# Patient Record
Sex: Female | Born: 1935 | Race: White | Hispanic: No | Smoking: Never smoker
Health system: Southern US, Community
[De-identification: ages and names within clinical notes are randomized; demographics above are authoritative.]

## PROBLEM LIST (undated history)

## (undated) DIAGNOSIS — I447 Left bundle-branch block, unspecified: Secondary | ICD-10-CM

## (undated) DIAGNOSIS — I119 Hypertensive heart disease without heart failure: Secondary | ICD-10-CM

## (undated) DIAGNOSIS — F329 Major depressive disorder, single episode, unspecified: Secondary | ICD-10-CM

## (undated) DIAGNOSIS — I4891 Unspecified atrial fibrillation: Secondary | ICD-10-CM

## (undated) DIAGNOSIS — F0390 Unspecified dementia without behavioral disturbance: Secondary | ICD-10-CM

## (undated) DIAGNOSIS — F32A Depression, unspecified: Secondary | ICD-10-CM

## (undated) DIAGNOSIS — E039 Hypothyroidism, unspecified: Secondary | ICD-10-CM

## (undated) DIAGNOSIS — K219 Gastro-esophageal reflux disease without esophagitis: Secondary | ICD-10-CM

## (undated) DIAGNOSIS — C50919 Malignant neoplasm of unspecified site of unspecified female breast: Secondary | ICD-10-CM

## (undated) DIAGNOSIS — Z950 Presence of cardiac pacemaker: Secondary | ICD-10-CM

## (undated) DIAGNOSIS — C679 Malignant neoplasm of bladder, unspecified: Secondary | ICD-10-CM

## (undated) HISTORY — DX: Left bundle-branch block, unspecified: I44.7

## (undated) HISTORY — DX: Hypertensive heart disease without heart failure: I11.9

## (undated) HISTORY — DX: Presence of cardiac pacemaker: Z95.0

---

## 1998-01-25 ENCOUNTER — Encounter: Admission: RE | Admit: 1998-01-25 | Discharge: 1998-04-25 | Payer: Self-pay | Admitting: Oncology

## 1998-02-01 ENCOUNTER — Other Ambulatory Visit: Admission: RE | Admit: 1998-02-01 | Discharge: 1998-02-01 | Payer: Self-pay | Admitting: Oncology

## 1998-06-30 ENCOUNTER — Ambulatory Visit (HOSPITAL_COMMUNITY): Admission: RE | Admit: 1998-06-30 | Discharge: 1998-06-30 | Payer: Self-pay | Admitting: Ophthalmology

## 1999-02-06 ENCOUNTER — Encounter: Admission: RE | Admit: 1999-02-06 | Discharge: 1999-02-24 | Payer: Self-pay | Admitting: Oncology

## 1999-02-09 ENCOUNTER — Encounter (INDEPENDENT_AMBULATORY_CARE_PROVIDER_SITE_OTHER): Payer: Self-pay

## 1999-02-09 ENCOUNTER — Ambulatory Visit (HOSPITAL_COMMUNITY): Admission: RE | Admit: 1999-02-09 | Discharge: 1999-02-09 | Payer: Self-pay | Admitting: Obstetrics & Gynecology

## 1999-03-27 ENCOUNTER — Encounter: Admission: RE | Admit: 1999-03-27 | Discharge: 1999-03-27 | Payer: Self-pay | Admitting: Oncology

## 1999-06-09 ENCOUNTER — Encounter: Payer: Self-pay | Admitting: Obstetrics & Gynecology

## 1999-06-14 ENCOUNTER — Encounter (INDEPENDENT_AMBULATORY_CARE_PROVIDER_SITE_OTHER): Payer: Self-pay | Admitting: Specialist

## 1999-06-14 ENCOUNTER — Inpatient Hospital Stay (HOSPITAL_COMMUNITY): Admission: RE | Admit: 1999-06-14 | Discharge: 1999-06-17 | Payer: Self-pay | Admitting: Obstetrics & Gynecology

## 1999-10-12 ENCOUNTER — Encounter: Admission: RE | Admit: 1999-10-12 | Discharge: 2000-01-10 | Payer: Self-pay | Admitting: Oncology

## 1999-11-20 ENCOUNTER — Ambulatory Visit (HOSPITAL_COMMUNITY): Admission: RE | Admit: 1999-11-20 | Discharge: 1999-11-20 | Payer: Self-pay | Admitting: Internal Medicine

## 1999-11-20 ENCOUNTER — Encounter: Payer: Self-pay | Admitting: Internal Medicine

## 2000-01-26 ENCOUNTER — Encounter: Admission: RE | Admit: 2000-01-26 | Discharge: 2000-01-26 | Payer: Self-pay

## 2000-02-22 ENCOUNTER — Encounter: Admission: RE | Admit: 2000-02-22 | Discharge: 2000-02-26 | Payer: Self-pay | Admitting: Oncology

## 2000-07-11 ENCOUNTER — Encounter: Payer: Self-pay | Admitting: Gastroenterology

## 2000-07-11 ENCOUNTER — Encounter: Admission: RE | Admit: 2000-07-11 | Discharge: 2000-07-11 | Payer: Self-pay | Admitting: Gastroenterology

## 2000-12-18 ENCOUNTER — Ambulatory Visit (HOSPITAL_COMMUNITY): Admission: RE | Admit: 2000-12-18 | Discharge: 2000-12-18 | Payer: Self-pay | Admitting: Internal Medicine

## 2000-12-18 ENCOUNTER — Encounter: Payer: Self-pay | Admitting: Internal Medicine

## 2001-09-18 ENCOUNTER — Encounter: Admission: RE | Admit: 2001-09-18 | Discharge: 2001-10-09 | Payer: Self-pay | Admitting: Oncology

## 2002-01-06 ENCOUNTER — Encounter: Admission: RE | Admit: 2002-01-06 | Discharge: 2002-01-06 | Payer: Self-pay | Admitting: Rheumatology

## 2002-01-06 ENCOUNTER — Encounter: Payer: Self-pay | Admitting: Rheumatology

## 2002-01-07 ENCOUNTER — Encounter: Payer: Self-pay | Admitting: Rheumatology

## 2002-01-07 ENCOUNTER — Encounter: Admission: RE | Admit: 2002-01-07 | Discharge: 2002-01-07 | Payer: Self-pay | Admitting: Rheumatology

## 2002-10-28 ENCOUNTER — Encounter: Admission: RE | Admit: 2002-10-28 | Discharge: 2002-10-28 | Payer: Self-pay | Admitting: Internal Medicine

## 2002-10-28 ENCOUNTER — Encounter: Payer: Self-pay | Admitting: Internal Medicine

## 2003-01-13 ENCOUNTER — Encounter (INDEPENDENT_AMBULATORY_CARE_PROVIDER_SITE_OTHER): Payer: Self-pay | Admitting: Specialist

## 2003-01-13 ENCOUNTER — Ambulatory Visit (HOSPITAL_COMMUNITY): Admission: RE | Admit: 2003-01-13 | Discharge: 2003-01-13 | Payer: Self-pay | Admitting: Gastroenterology

## 2003-01-18 ENCOUNTER — Ambulatory Visit (HOSPITAL_COMMUNITY): Admission: RE | Admit: 2003-01-18 | Discharge: 2003-01-18 | Payer: Self-pay | Admitting: Gastroenterology

## 2003-01-18 ENCOUNTER — Encounter: Payer: Self-pay | Admitting: Gastroenterology

## 2003-02-05 ENCOUNTER — Encounter: Payer: Self-pay | Admitting: Pediatrics

## 2003-02-05 ENCOUNTER — Ambulatory Visit (HOSPITAL_COMMUNITY): Admission: RE | Admit: 2003-02-05 | Discharge: 2003-02-05 | Payer: Self-pay | Admitting: Pediatrics

## 2003-02-05 ENCOUNTER — Encounter (INDEPENDENT_AMBULATORY_CARE_PROVIDER_SITE_OTHER): Payer: Self-pay | Admitting: Specialist

## 2003-03-10 ENCOUNTER — Ambulatory Visit (HOSPITAL_COMMUNITY): Admission: RE | Admit: 2003-03-10 | Discharge: 2003-03-10 | Payer: Self-pay | Admitting: Internal Medicine

## 2003-03-10 ENCOUNTER — Encounter: Payer: Self-pay | Admitting: Internal Medicine

## 2003-03-29 ENCOUNTER — Emergency Department (HOSPITAL_COMMUNITY): Admission: EM | Admit: 2003-03-29 | Discharge: 2003-03-30 | Payer: Self-pay | Admitting: Emergency Medicine

## 2003-03-29 ENCOUNTER — Encounter: Payer: Self-pay | Admitting: Emergency Medicine

## 2003-05-28 ENCOUNTER — Encounter: Admission: RE | Admit: 2003-05-28 | Discharge: 2003-05-28 | Payer: Self-pay | Admitting: Orthopedic Surgery

## 2003-05-31 ENCOUNTER — Ambulatory Visit (HOSPITAL_COMMUNITY): Admission: RE | Admit: 2003-05-31 | Discharge: 2003-05-31 | Payer: Self-pay | Admitting: Orthopedic Surgery

## 2003-05-31 ENCOUNTER — Ambulatory Visit (HOSPITAL_BASED_OUTPATIENT_CLINIC_OR_DEPARTMENT_OTHER): Admission: RE | Admit: 2003-05-31 | Discharge: 2003-05-31 | Payer: Self-pay | Admitting: Orthopedic Surgery

## 2003-09-16 ENCOUNTER — Inpatient Hospital Stay (HOSPITAL_COMMUNITY): Admission: EM | Admit: 2003-09-16 | Discharge: 2003-09-21 | Payer: Self-pay | Admitting: Emergency Medicine

## 2003-09-20 DIAGNOSIS — Z950 Presence of cardiac pacemaker: Secondary | ICD-10-CM

## 2003-09-20 HISTORY — DX: Presence of cardiac pacemaker: Z95.0

## 2003-09-20 HISTORY — PX: CARDIAC CATHETERIZATION: SHX172

## 2003-09-20 HISTORY — PX: PERMANENT PACEMAKER INSERTION: SHX6023

## 2004-03-11 ENCOUNTER — Emergency Department (HOSPITAL_COMMUNITY): Admission: EM | Admit: 2004-03-11 | Discharge: 2004-03-11 | Payer: Self-pay | Admitting: *Deleted

## 2004-05-18 ENCOUNTER — Ambulatory Visit (HOSPITAL_COMMUNITY): Admission: RE | Admit: 2004-05-18 | Discharge: 2004-05-18 | Payer: Self-pay | Admitting: Internal Medicine

## 2004-07-10 ENCOUNTER — Encounter: Admission: RE | Admit: 2004-07-10 | Discharge: 2004-07-10 | Payer: Self-pay | Admitting: Neurosurgery

## 2004-07-13 ENCOUNTER — Encounter: Admission: RE | Admit: 2004-07-13 | Discharge: 2004-07-13 | Payer: Self-pay | Admitting: Neurosurgery

## 2004-08-04 ENCOUNTER — Encounter: Admission: RE | Admit: 2004-08-04 | Discharge: 2004-08-04 | Payer: Self-pay | Admitting: Radiology

## 2004-09-06 ENCOUNTER — Encounter: Admission: RE | Admit: 2004-09-06 | Discharge: 2004-09-06 | Payer: Self-pay | Admitting: Neurosurgery

## 2004-09-13 ENCOUNTER — Encounter: Admission: RE | Admit: 2004-09-13 | Discharge: 2004-09-13 | Payer: Self-pay | Admitting: Neurosurgery

## 2004-10-25 ENCOUNTER — Encounter: Admission: RE | Admit: 2004-10-25 | Discharge: 2004-10-25 | Payer: Self-pay | Admitting: Internal Medicine

## 2005-06-12 ENCOUNTER — Ambulatory Visit: Payer: Self-pay | Admitting: Oncology

## 2005-10-12 ENCOUNTER — Encounter: Admission: RE | Admit: 2005-10-12 | Discharge: 2005-10-12 | Payer: Self-pay | Admitting: Internal Medicine

## 2005-10-17 ENCOUNTER — Encounter: Admission: RE | Admit: 2005-10-17 | Discharge: 2005-10-17 | Payer: Self-pay | Admitting: Internal Medicine

## 2005-10-25 ENCOUNTER — Encounter: Admission: RE | Admit: 2005-10-25 | Discharge: 2005-10-25 | Payer: Self-pay | Admitting: Internal Medicine

## 2005-10-25 IMAGING — RF IR DG VERTEBROPLASTY FL
12 series · 12 of 12 positions shown · non-contrast
Comparison: none

CLINICAL DATA: The patient has a T11 compression fracture which has been refractive to conservative management.  She has persistent severe pain.  
 T11 VERTEBROPLASTY:
 The patient was placed prone, her back prepped and draped in a sterile fashion.  She received titrated levels of Versed and Fentanyl for conscious sedation throughout the procedure. 
 The left T11 pedicle was localized and anesthetized with 1% Lidocaine.  A 13 gauge needle was placed through the pedicle up against the anterior third of the vertebra in the lateral projection.  Methylmethacrylate was formed.  This was injected under continuous fluoroscopy.  There was filling of the inferior half of the vertebra. This crosses midline.  No extraosseous extension was appreciated. The needle was subsequently repositioned more superiorly within the vertebra.  A follow-up injection shows material spread along the inferior endplate of T11.  No extraosseous extension. 
 The patient tolerated the procedure without difficulty.  She was transferred to Recovery in excellent condition.

[Series 1: vertebro  plasty · 1 of 1 slices shown (1 of 11)]
[im 1/1]
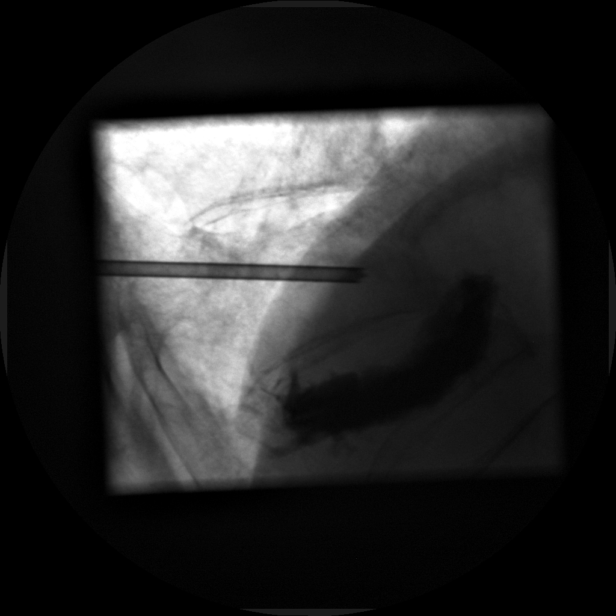

[Series 2: vertebro  plasty · 1 of 1 slices shown (2 of 11)]
[im 1/1]
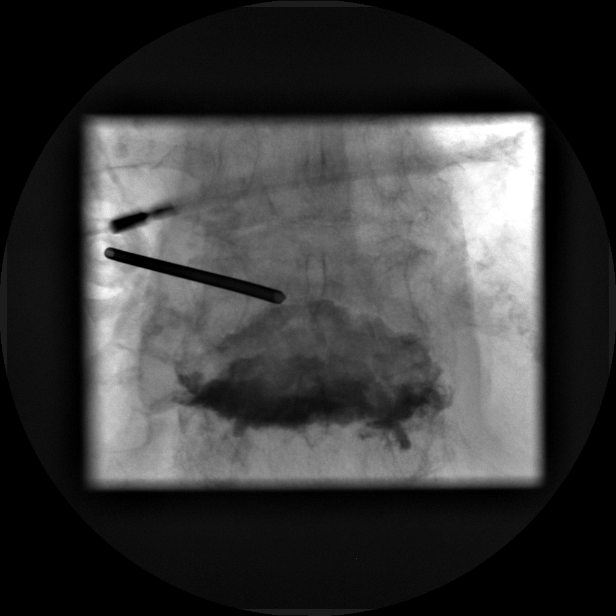

[Series 3: vertebro  plasty · 1 of 1 slices shown (3 of 11)]
[im 1/1]
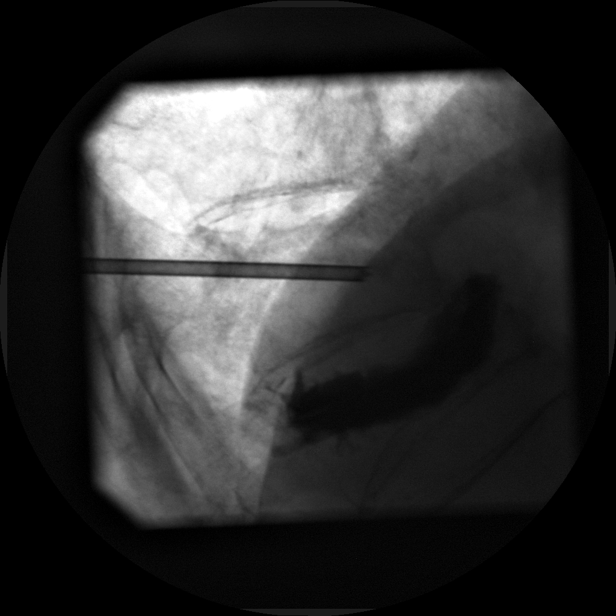

[Series 4: vertebro  plasty · 1 of 1 slices shown (4 of 11)]
[im 1/1]
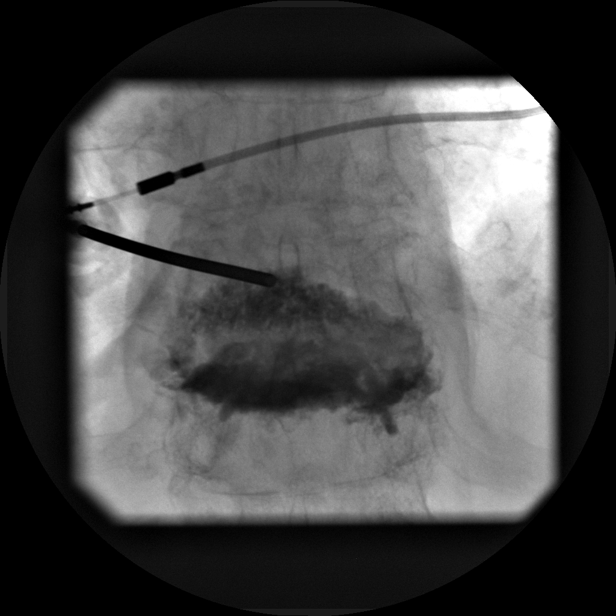

[Series 5: vertebro  plasty · 1 of 1 slices shown (5 of 11)]
[im 1/1]
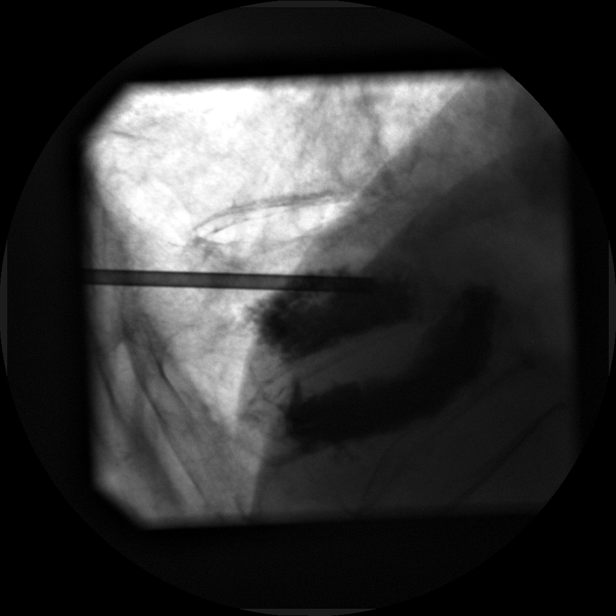

[Series 6: vertebro  plasty · 1 of 1 slices shown (6 of 11)]
[im 1/1]
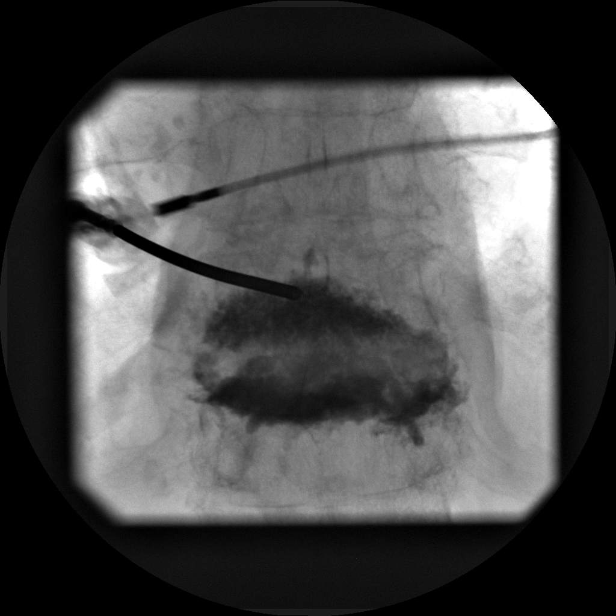

[Series 7: (hospital) · 1 of 1 slices shown]
[im 1/1]
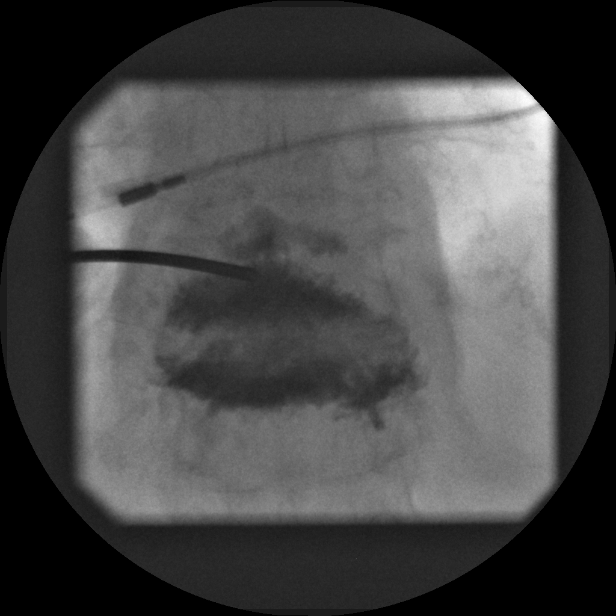

[Series 8: vertebro  plasty · 1 of 1 slices shown (7 of 11)]
[im 1/1]
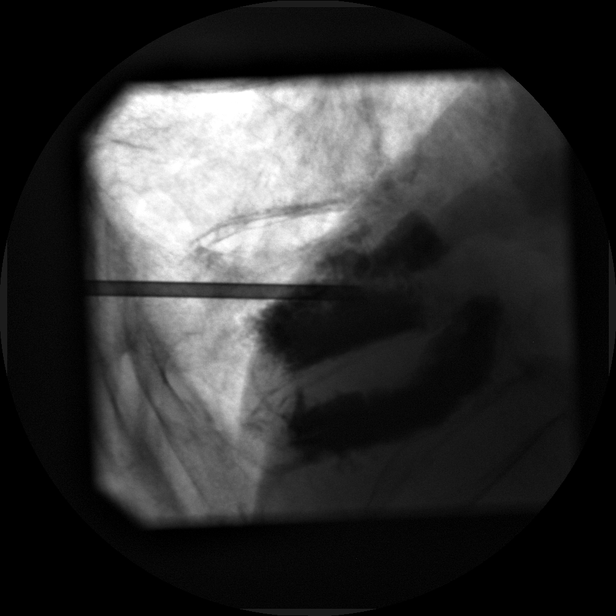

[Series 9: vertebro  plasty · 1 of 1 slices shown (8 of 11)]
[im 1/1]
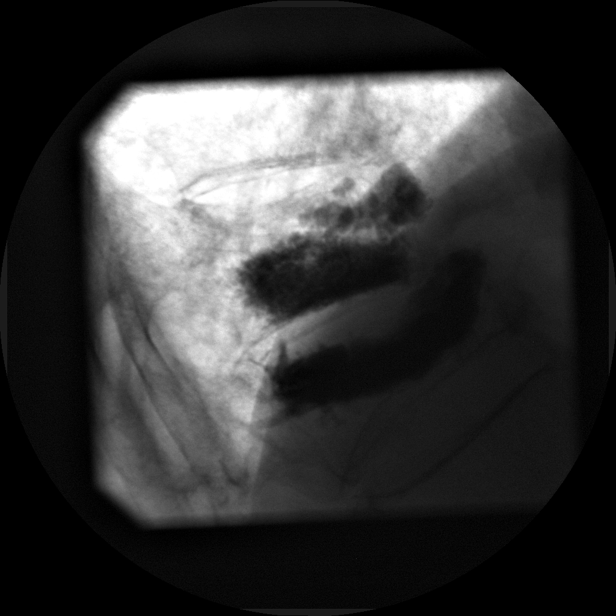

[Series 10: vertebro  plasty · 1 of 1 slices shown (9 of 11)]
[im 1/1]
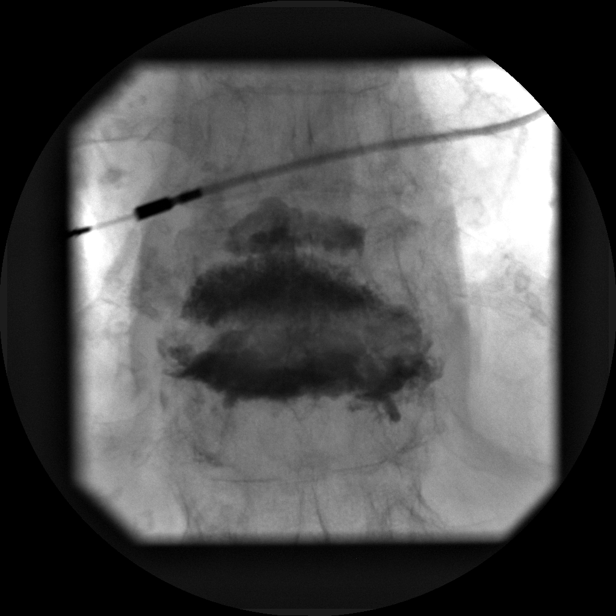

[Series 11: vertebro  plasty · 1 of 1 slices shown (10 of 11)]
[im 1/1]
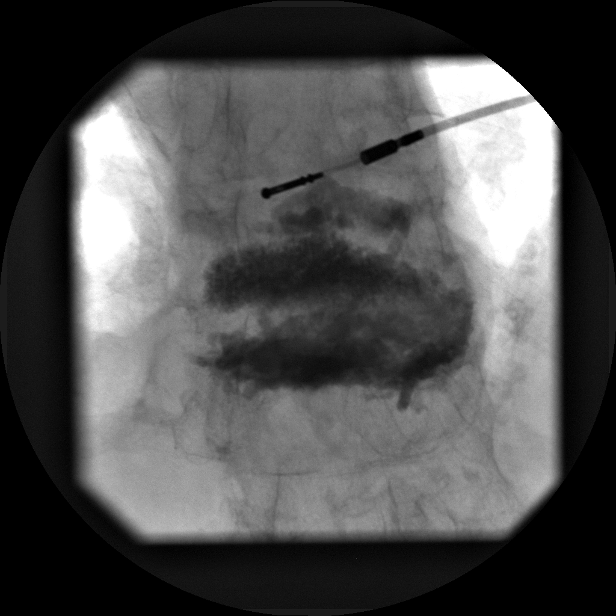

[Series 12: vertebro  plasty · 1 of 1 slices shown (11 of 11)]
[im 1/1]
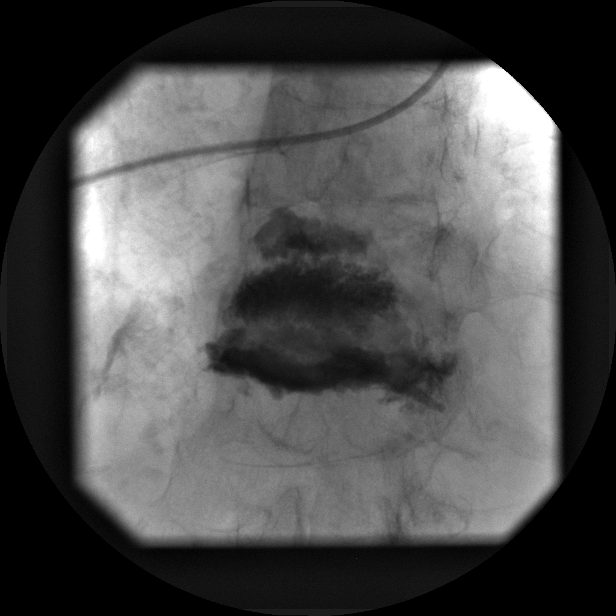

[12 of 12 positions shown; findings below may reference images not displayed]

IMPRESSION: T11 transpedicular vertebroplasty.

## 2005-11-05 ENCOUNTER — Encounter: Admission: RE | Admit: 2005-11-05 | Discharge: 2005-11-05 | Payer: Self-pay | Admitting: Internal Medicine

## 2006-06-07 ENCOUNTER — Ambulatory Visit: Payer: Self-pay | Admitting: Oncology

## 2007-02-21 ENCOUNTER — Emergency Department (HOSPITAL_COMMUNITY): Admission: EM | Admit: 2007-02-21 | Discharge: 2007-02-21 | Payer: Self-pay | Admitting: Emergency Medicine

## 2007-07-09 ENCOUNTER — Ambulatory Visit: Payer: Self-pay | Admitting: Oncology

## 2007-09-02 ENCOUNTER — Other Ambulatory Visit: Admission: RE | Admit: 2007-09-02 | Discharge: 2007-09-02 | Payer: Self-pay | Admitting: Gastroenterology

## 2008-07-14 ENCOUNTER — Ambulatory Visit: Payer: Self-pay | Admitting: Oncology

## 2010-12-15 NOTE — Discharge Summary (Signed)
NAME:  [name], [name]                         ACCOUNT NO.:  [id number]   MEDICAL RECORD NO.:  [id number]                   PATIENT TYPE:  INP   LOCATION:  3736                                 FACILITY:  [facility]   PHYSICIAN:  Cristy Hilts. Jacinto Halim, M.D.                  DATE OF BIRTH:  23-Aug-1918   DATE OF ADMISSION:  09/16/2003  DATE OF DISCHARGE:  09/21/2003                                 DISCHARGE SUMMARY   Ms. Amerika Harrell is an 77 year old white married female patient of Dr.  Jacinto Halim who came to the hospital with complaints of chest discomfort.  She  also has known chronic atrial fibrillation.  She had complaints about  shortness of breath and weight loss.  She was admitted for further cardiac  catheterization.  She was treated by Dr. Clarene Duke.  During her  hospitalization, she was diagnosed with sick sinus syndrome with heart rates  in the 30s to 40s and then up into the 110 range.  Because of her chest  pain, she underwent dobutamine Cardiolite testing on September 17, 2003.  This showed a scar in the LAD territory.  She had no known coronary disease.  It was decided that she should undergo cardiac catheterization.  It was also  decided that she would need permanent pacemaker to help to control her rapid  ventricular response with atrial fibrillation.  She was also having  significant pauses.  On September 20, 2003, she underwent cardiac  catheterization which revealed no coronary artery disease.  Her EF was 65  to75%.  The cardiac catheterization was performed by Dr. Elsie Lincoln.  She then  went on the same day to undergo permanent pacemaker implantation.  This was  done by Dr. Lenise Herald.  She had insertion of a Medtronic Impulse  [id number], Serial Number [id number] with passive atrial and ventricular leads.   The following day, she underwent interrogation of the pacemaker.  All values  were  within normal limits; no changes were made.  She was seen by Dr.  Susa Griffins on September 21, 2003, and considered stable to be  discharged home.  Chest x-ray post implant showed right pacer placement  without pneumothorax.   Labs on September 21, 2003, showed hemoglobin 12.9, hematocrit 38.1, WBC 7.2,  and platelets 261.  INR on September 20, 2003, was 1.3.  Sodium 137,  potassium 4.4, glucose 96, BUN 11, creatinine 0.6.  CK-MBs were all  negative.  Total cholesterol 151, triglycerides 53, HDL 68, LDL 70.  CA  0.5, digoxin level 1.5, TSH 4.914.   DISCHARGE MEDICATIONS:  1. Synthroid 75 mcg once per day.  2. Lanoxin 0.125 mg once per day.  3. Hyzaar 100/25 once per day.  4. Zoloft 12.5 mg once per day.  5. Rythmol SR 325 mg twice per day.  6. She should start Coumadin on the day after discharge at 2.5 mg once per  day dose.  7. K-Dur 10 mEq one every day.   DISCHARGE INSTRUCTIONS:  1. She should do no lifting, pushing, pulling with the arm on her pacer     side.  She can raise her arm to her earlobe and then to the top of the     head the following day.  She should do no stretching.  2. She will have blood for Coumadin dosing done on the following Monday.  3. She will keep the pacer site dry for four days, then she may wash gently     with soap and water.  4. She will have a wound site check on March 4 at 10:30 with Corine Shelter.   DISCHARGE DIAGNOSES:  1. Paroxysmal atrial fibrillation with sick sinus syndrome with bradycardia     going down to the 20s and heart rate up into the 110 to 120 range.  She     also has 2.5 second pauses, symptomatic.  2. Chest pain.  She underwent dobutamine Cardiolite which showed a scar on     her heart without any history of prior atherosclerotic cardiovascular     disease.  It was decided she should undergo cardiac catheterization which     revealed normal coronary arteries, no coronary artery disease.  3. History of left breast cancer and bladder cancer.  4. Right pulmonary effusion with thoracentesis in 2004 negative for     malignant  cells.  5. Hypothyroidism.  6. Hypertension.      Lezlie Octave, N.P.                        Cristy Hilts. Jacinto Halim, M.D.    BB/MEDQ  D:  12/01/2003  T:  12/02/2003  Job:  [id number]   cc:   Wilson Singer, M.D.  [address]  [place]  [place] [address]  Fax: [phone]

## 2010-12-15 NOTE — H&P (Signed)
NAME:  Heidi Harrell, Heidi Harrell                         ACCOUNT NO.:  [id number]   MEDICAL RECORD NO.:  [id number]                   PATIENT TYPE:  INP   LOCATION:  3736                                 FACILITY:  [facility]   PHYSICIAN:  Thereasa Solo. Little, M.D.              DATE OF BIRTH:  [birth year]   DATE OF ADMISSION:  09/16/2003  DATE OF DISCHARGE:                                HISTORY & PHYSICAL   PRIMARY CARE PHYSICIAN:  Dr. Karilyn Cota.   PRIMARY CARDIOLOGIST:  Dr. Chanda Busing.   CHIEF COMPLAINT:  Chest pain, nausea, clammy feeling.   BRIEF HISTORY:  The patient is an 77 year old white female with a history of  PAF and left bundle branch block. Complains of some right sided pain under  the right breast for a couple of weeks. Today, she had a different type of  pain which lasted 15 to 20 minutes. It was relieved by rest but it was  different enough to scare her. She called Dr. Karilyn Cota and then our office.  The event occurred around 2 p.m. today, and she was sent to the emergency  room by our office staff for evaluation. She has had no further discomfort  since the initial discomfort, and it was relieved simply by rest.   PAST MEDICAL HISTORY:  1. PAF with a left bundle branch block on Coumadin.  2. Cardiac catheterization 1998 was negative for coronary artery disease.     Cardiolite study August 2002 was negative for ischemia.  3. The patient had a right pleural effusion with a thoracentesis July 2004.     Pleural fluid was negative for malignant cells.  4. She has a positive history of left breast cancer and a history of bladder     cancer.   ALLERGIES:  SULFA, CODEINE causes her to be sick. TOPROL causes her also  to be sick; she is not sure what the problem was.   CURRENT MEDICATIONS:  1. Synthroid 0.75 mg q.d.  2. Coumadin 2.5 mg q.d.  3. Lanoxin 0.125 mg q.d.  4. Hyzaar 100/25 one q.d.  5. Clonazepam 0.5 mg half tablet p.o. b.i.d.  6. Zoloft 25 mg half tablet q.d.  7.  Rhythmol SR 325 mg b.i.d.   REVIEW OF SYSTEMS:  CARDIOVASCULAR:  Negative. PULMONARY:  She denies PND,  orthopnea, and no functional changes, no increased dyspnea on exertion.  CARDIAC:  No chest pain except for the discomfort under her right breast  which has been intermittent for the last two weeks. GASTROINTESTINAL:  She  has had a 20-pound weight loss, decreased appetite since July of 2004. No  diarrhea or constipation. GENITOURINARY:  Negative. EXTREMITIES:  Negative.  No edema or swelling.   FAMILY HISTORY:  Noncontributory.   PHYSICAL EXAMINATION:  GENERAL:  This is a thin 77 year old female, frail  but no acute distress.  VITAL SIGNS:  Her blood pressure is 124/43, pulse 57, respirations 18  and  unlabored.  HEENT:  Head:  Normocephalic. Eyes:  PERRLA. EOMs intact. Fundi not  visualized. Ears, nose, throat, and mouth:  Grossly within normal limits.  NECK:  No bruits, no JVD, no thyromegaly.  CHEST:  Clear to auscultation and percussion. There were no rales, rhonchi,  or wheezing.  CARDIAC:  Normal S1. Heart rate was irregular. There is a soft 1/6 systolic  murmur at the base.  ABDOMEN:  Soft, nontender, positive bowel sounds, no hepatosplenomegaly.  EXTREMITIES:  Shows no clubbing, cyanosis, or edema.  NEUROLOGICAL:  No focal deficits.   IMPRESSION:  1. Chest pain, rule out myocardial infarction.  2. History of right pleural effusion, July 2004, negative for malignant     cells.  3. Paroxysmal atrial fibrillation/left bundle branch block on Coumadin and     Rhythmol.  4. Hypothyroidism.  5. History of breast cancer and bladder cancer now with 20-pound weight loss     since July of 2004 and decreased appetite.   PLAN:  The patient is undergoing rule out protocol. We are going to send her  for a PA and lateral chest x-ray and if there is any abnormalities obtain a  CT scan of her chest. Further workup and treatment as indicated.      Eber Hong, P.A.                  Thereasa Solo. Little, M.D.    WDJ/MEDQ  D:  09/16/2003  T:  09/17/2003  Job:  [id number]   cc:   Karilyn Cota, M.D.

## 2010-12-15 NOTE — Cardiovascular Report (Signed)
NAME:  Heidi Harrell, Heidi Harrell                         ACCOUNT NO.:  [id number]   MEDICAL RECORD NO.:  [id number]                   PATIENT TYPE:  INP   LOCATION:  3736                                 FACILITY:  [facility]   PHYSICIAN:  Darlin Priestly, M.D.             DATE OF BIRTH:  [birth year]   DATE OF PROCEDURE:  09/20/2003  DATE OF DISCHARGE:                              CARDIAC CATHETERIZATION   PROCEDURES:  Insertion of dual-chamber pacemaker using a Medtronic Enpulse  E2 [id number], serial number [id number] with passive atrial and ventricular leads.   ATTENDING PHYSICIAN:  Darlin Priestly, M.D.   COMPLICATIONS:  None.   INDICATIONS:  Heidi Harrell is an 77 year old female patient of Dr. Lavonne Chick  and Dr. Karilyn Cota without a history of paroxysmal atrial fibrillation, chronic  left bundle branch block, history of left breast CA initially admitted with  chest pain.  She was noted on the monitor to be back in atrial fibrillation  with rate of 50-60.  However, this was intermittent and she would develop  occasion sinus rhythm with 2-3 second pauses.  Per the nursing staff, the  patient's heart rate would drop into the 20s at night.  She is now referred  for dual-chamber pacemaker implant in order to use negative chronotropic  agents for rate control.   DESCRIPTION OF OPERATION:  After obtaining informed written consent, the  patient was brought to the cardiac catheterization laboratory.  Right  anterior chest wall was prepped and draped in sterile fashion.  ECG monitor  was established.  1% lidocaine was then used to anesthetize the right  subclavian area.  Approximately 3 cm horizontal incision was the made  beneath the right clavicle and hemostasis was obtained with electrocautery.  Blunt dissection was used to carry this down to the right pectoral fascia.  Approximately, 3-4 cm pocket was then created over the right pectoral fascia  and hemostasis was again obtained with electrocautery.   Next, right  subclavian vein was then entered under fluoroscopic guidance and J-wire was  easily passed into the SVC and right atrium.  Next, a #9 [place] dilator and  sheath were inserted over the retained guide wire and the dilator and guide  wire were removed.  Following this, a Medtronic 52 cm passive lead model  #5092, serial number [id number] was then easily passed into the right atrium  and the guide wire was retained.  Peel-away sheath was then removed.  A  second 9 French dilator and sheath were then inserted over the retained  guide wire and the dilator and guide wire were removed.  A second 45 cm  Medtronic atrial lead model 4592, serial number [id number] was then passed  into the right atrium and the guide wire was then again retained.  The peel-  away sheath was removed and the guide wire was anchored to the sheath with a  hemostat.  A J-curve was then placed  on the ventricular stylet and the  ventricular lead was then allowed to prolapse into the RV apex.  It should  be noted that the patient did develop complete heart block once the right  ventricular lead was positioned in the RV apex.  We were able to sense R  waves at 21 mV prior to becoming pacer dependent.  Threshold was 0.5 V at  0.5 msec.  Impedance was 693 ohms.  Current was 1 milliamp.  There is no  evidence of diaphragmatic stimulation at 10 V.  The J-curve stylet was then  pulled back and the right atrial lead was then positioned in the right  atrial appendage.  P waves were sensed at 3.1 mV.  Impedance in the atrial  lead was 543 ohms.  Threshold in the atrium was 0.5 V at 0.5 msec.  Current  was 1.1 milliamps with negative 10 V.  Each lead was then sutured in place  using two silk sutures per lead.  The pocket was then copiously irrigated  with 1% kanamycin solution.  The retaininig guide wire was then removed.  The leads were connected in serial fashion to a Medtronic Enpulse E2 [id number]  generator, serial number  [id number] and head screws were tightened and  pacing was confirmed.  A single silk suture was then placed at the apex of  the pocket.  The pacing wires and generator were then delivered into the  pocket without difficulty.  The patient was then closed using running 2-0  Dexon for the subcutaneous layer.  The skin was closed using running 5-0  Dexon.  Steri-Strips were applied.  The patient transferred to the recovery  room in stable condition.   CONCLUSION:  Successful placement of a Medtronic Enpulse [id number] generator,  serial number [id number] with passive atrial and ventricular leads.                                               Darlin Priestly, M.D.    RHM/MEDQ  D:  09/20/2003  T:  09/20/2003  Job:  [id number]   cc:   Madaline Savage, M.D.  [address]  [place]  [place] [address]  Fax: [phone]   Wilson Singer, M.D.  [address]  [place]  [place] [address]  Fax: [phone]

## 2010-12-15 NOTE — Op Note (Signed)
NAME:  Heidi Harrell, RICKETT                         ACCOUNT NO.:  [id number]   MEDICAL RECORD NO.:  [id number]                   PATIENT TYPE:  AMB   LOCATION:  DSC                                  FACILITY:  [facility]   PHYSICIAN:  Robert A. Thurston Hole, M.D.              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   POSTOPERATIVE DIAGNOSIS:  Left knee lateral meniscus tear with  chondromalacia and synovitis.   PROCEDURE:  1. Left knee examination under anesthesia followed by arthroscopic partial     lateral meniscectomy.  2. Left knee chondroplasty with partial synovectomy.   SURGEON:  Elana Alm. Thurston Hole, M.D.   ASSISTANT:  Julien Girt, P.A.   ANESTHESIA:  General.   OPERATIVE TIME:  30 minutes.   COMPLICATIONS:  None.   INDICATIONS FOR PROCEDURE:  [name] is an 77 year old woman who has had  significant left knee pain for the past 3-4 months, increasing in nature  with signs, symptoms and MRI documenting meniscal tear, mild chondromalacia  and synovitis. She has otherwise failed conservative care and is now to  undergo arthroscopy.  She is routinely on Coumadin and this has been stopped  5 days preop with her cardiologist's input, and is now to undergo  arthroscopy.   DESCRIPTION OF PROCEDURE:  [name] was brought to the operating room on  05/31/03, placed on the operating table in the supine position. After an  adequate level of general anesthesia was obtained, her left knee was  sterilely injected with 0.5% Marcaine with epinephrine.  The left knee was  examined under anesthesia. Range of motion 0-130 degrees, knee was stable to  ligamentous exam with normal patellar tracking.  Left leg was prepped using  sterile DuraPrep and draped using sterile technique.   Originally, through an  inferolateral portal, the arthroscope with a pump  attached was placed through an inferomedial portal and arthroscopic probe  was placed.  On initial inspection of the medial compartment, she had mild  grade 1-2 chondromalacia.  Medial meniscus was intact.  Intercondylar notch  was inspected, anterior and posterior cruciate ligaments were normal.  Lateral compartment inspected, 30% grade 3 chondromalacia which was  debrided, lateral meniscus tearing 50% of the posterolateral and anterior  horn which was resected back to a stable rim.  Patellofemoral joint  inspected, mild grade 1-2 chondromalacia. The patellar tracked normally.  Moderate synovitis in the medial and lateral gutters were debrided,  otherwise they were free of pathology.   After this was done, it was felt that all pathology had been satisfactorily  addressed. The instruments were removed. The portals were closed with 3-0  nylon suture and injected with 0.25% Marcaine with epinephrine. Sterile  dressings were applied.  The patient  was awakened and taken to the recovery  room in stable condition.   FOLLOW UP CARE:  Ms. Egley will be followed as an outpatient on Darvocet  for pain. She will start back on her Coumadin tomorrow. Will see her back in  the office in a week for sutures out and follow up.                                               Robert A. Thurston Hole, M.D.    RAW/MEDQ  D:  05/31/2003  T:  05/31/2003  Job:  [id number]

## 2011-05-14 LAB — URINALYSIS, ROUTINE W REFLEX MICROSCOPIC
Nitrite: NEGATIVE
Protein, ur: NEGATIVE
Urobilinogen, UA: 0.2

## 2011-05-14 LAB — COMPREHENSIVE METABOLIC PANEL
ALT: 21
BUN: 8
CO2: 32
Calcium: 9.1
Creatinine, Ser: 0.66
GFR calc non Af Amer: >60
Glucose, Bld: 111 — ABNORMAL HIGH

## 2011-05-14 LAB — CBC
HCT: 25.6 — ABNORMAL LOW
Hemoglobin: 8.9 — ABNORMAL LOW
MCHC: 34.7
MCV: 95.5
RBC: 2.68 — ABNORMAL LOW

## 2011-05-14 LAB — PROTIME-INR
INR: 3.5 — ABNORMAL HIGH
Prothrombin Time: 37.6 — ABNORMAL HIGH

## 2011-05-14 LAB — DIGOXIN LEVEL: Digoxin Level: 0.8

## 2011-05-14 LAB — DIFFERENTIAL
Eosinophils Absolute: 0
Lymphocytes Relative: 20
Lymphs Abs: 0.7
Neutro Abs: 2.4
Neutrophils Relative %: 72

## 2011-05-14 LAB — URINE CULTURE

## 2011-08-05 ENCOUNTER — Emergency Department (HOSPITAL_COMMUNITY): Payer: Medicare Other

## 2011-08-05 ENCOUNTER — Emergency Department (HOSPITAL_COMMUNITY)
Admission: EM | Admit: 2011-08-05 | Discharge: 2011-08-05 | Disposition: A | Discharge status: Home / Self Care | Payer: Medicare Other | Attending: Emergency Medicine | Admitting: Emergency Medicine

## 2011-08-05 ENCOUNTER — Other Ambulatory Visit: Payer: Self-pay

## 2011-08-05 DIAGNOSIS — Z79899 Other long term (current) drug therapy: Secondary | ICD-10-CM | POA: Insufficient documentation

## 2011-08-05 DIAGNOSIS — Z853 Personal history of malignant neoplasm of breast: Secondary | ICD-10-CM | POA: Insufficient documentation

## 2011-08-05 DIAGNOSIS — R51 Headache: Secondary | ICD-10-CM

## 2011-08-05 DIAGNOSIS — D689 Coagulation defect, unspecified: Secondary | ICD-10-CM | POA: Insufficient documentation

## 2011-08-05 DIAGNOSIS — F329 Major depressive disorder, single episode, unspecified: Secondary | ICD-10-CM | POA: Insufficient documentation

## 2011-08-05 DIAGNOSIS — M810 Age-related osteoporosis without current pathological fracture: Secondary | ICD-10-CM | POA: Insufficient documentation

## 2011-08-05 DIAGNOSIS — R112 Nausea with vomiting, unspecified: Secondary | ICD-10-CM | POA: Insufficient documentation

## 2011-08-05 DIAGNOSIS — Z8551 Personal history of malignant neoplasm of bladder: Secondary | ICD-10-CM | POA: Insufficient documentation

## 2011-08-05 DIAGNOSIS — T45515A Adverse effect of anticoagulants, initial encounter: Secondary | ICD-10-CM

## 2011-08-05 DIAGNOSIS — W010XXA Fall on same level from slipping, tripping and stumbling without subsequent striking against object, initial encounter: Secondary | ICD-10-CM | POA: Insufficient documentation

## 2011-08-05 DIAGNOSIS — E876 Hypokalemia: Secondary | ICD-10-CM | POA: Insufficient documentation

## 2011-08-05 DIAGNOSIS — E039 Hypothyroidism, unspecified: Secondary | ICD-10-CM | POA: Insufficient documentation

## 2011-08-05 DIAGNOSIS — R079 Chest pain, unspecified: Secondary | ICD-10-CM | POA: Insufficient documentation

## 2011-08-05 DIAGNOSIS — K219 Gastro-esophageal reflux disease without esophagitis: Secondary | ICD-10-CM | POA: Insufficient documentation

## 2011-08-05 DIAGNOSIS — F3289 Other specified depressive episodes: Secondary | ICD-10-CM | POA: Insufficient documentation

## 2011-08-05 DIAGNOSIS — R197 Diarrhea, unspecified: Secondary | ICD-10-CM | POA: Insufficient documentation

## 2011-08-05 DIAGNOSIS — F068 Other specified mental disorders due to known physiological condition: Secondary | ICD-10-CM | POA: Insufficient documentation

## 2011-08-05 HISTORY — DX: Major depressive disorder, single episode, unspecified: F32.9

## 2011-08-05 HISTORY — DX: Unspecified dementia, unspecified severity, without behavioral disturbance, psychotic disturbance, mood disturbance, and anxiety: F03.90

## 2011-08-05 HISTORY — DX: Gastro-esophageal reflux disease without esophagitis: K21.9

## 2011-08-05 HISTORY — DX: Hypothyroidism, unspecified: E03.9

## 2011-08-05 HISTORY — DX: Malignant neoplasm of bladder, unspecified: C67.9

## 2011-08-05 HISTORY — DX: Depression, unspecified: F32.A

## 2011-08-05 HISTORY — DX: Unspecified atrial fibrillation: I48.91

## 2011-08-05 HISTORY — DX: Malignant neoplasm of unspecified site of unspecified female breast: C50.919

## 2011-08-05 LAB — DIFFERENTIAL
Basophils Absolute: 0 10*3/uL (ref 0.0–0.1)
Lymphocytes Relative: 15 % (ref 12–46)
Monocytes Absolute: 0.9 10*3/uL (ref 0.1–1.0)
Neutro Abs: 9.6 10*3/uL — ABNORMAL HIGH (ref 1.7–7.7)

## 2011-08-05 LAB — COMPREHENSIVE METABOLIC PANEL
ALT: 14 U/L (ref 0–35)
AST: 22 U/L (ref 0–37)
CO2: 27 mEq/L (ref 19–32)
Chloride: 100 mEq/L (ref 96–112)
Creatinine, Ser: 0.65 mg/dL (ref 0.50–1.10)
GFR calc non Af Amer: 76 mL/min — ABNORMAL LOW (ref >90)
Total Bilirubin: 0.3 mg/dL (ref 0.3–1.2)

## 2011-08-05 LAB — CBC
HCT: 36.6 % (ref 36.0–46.0)
Hemoglobin: 12.3 g/dL (ref 12.0–15.0)
RBC: 3.78 MIL/uL — ABNORMAL LOW (ref 3.87–5.11)
RDW: 13.9 % (ref 11.5–15.5)
WBC: 12.4 10*3/uL — ABNORMAL HIGH (ref 4.0–10.5)

## 2011-08-05 LAB — APTT: aPTT: 38 seconds — ABNORMAL HIGH (ref 24–37)

## 2011-08-05 LAB — POCT I-STAT TROPONIN I: Troponin i, poc: 0 ng/mL (ref 0.00–0.08)

## 2011-08-05 MED ORDER — POTASSIUM CHLORIDE CRYS ER 20 MEQ PO TBCR
40.0000 meq | EXTENDED_RELEASE_TABLET | Freq: Once | ORAL | Status: AC
  Administered 2011-08-05: 40 meq via ORAL
  Filled 2011-08-05: qty 2

## 2011-08-05 MED ORDER — SODIUM CHLORIDE 0.9 % IV SOLN
INTRAVENOUS | Status: DC
  Administered 2011-08-05: 02:00:00 via INTRAVENOUS

## 2011-08-05 MED ORDER — POTASSIUM CHLORIDE ER 10 MEQ PO TBCR: 20.0000 meq | EXTENDED_RELEASE_TABLET | Freq: Two times a day (BID) | ORAL | Status: DC

## 2011-08-05 MED ORDER — MORPHINE SULFATE 4 MG/ML IJ SOLN
4.0000 mg | Freq: Once | INTRAMUSCULAR | Status: AC
  Administered 2011-08-05: 4 mg via INTRAVENOUS
  Filled 2011-08-05: qty 1

## 2011-08-05 MED ORDER — ONDANSETRON HCL 4 MG/2ML IJ SOLN
4.0000 mg | Freq: Once | INTRAMUSCULAR | Status: AC
  Administered 2011-08-05: 4 mg via INTRAVENOUS
  Filled 2011-08-05: qty 2

## 2011-08-05 MED ORDER — POTASSIUM CHLORIDE 10 MEQ/100ML IV SOLN
10.0000 meq | INTRAVENOUS | Status: AC
  Administered 2011-08-05: 10 meq via INTRAVENOUS
  Filled 2011-08-05: qty 100

## 2011-08-05 NOTE — ED Notes (Signed)
2nd 10 mEq of potassium was overlooked and IV pulled for pt to go home.  Pt only received 10 mEq of potassium.  Dr. Lynelle Doctor notified and stated it was ok for pt to go home and not receive 2nd run of 10 mEq of potassium

## 2011-08-05 NOTE — ED Notes (Signed)
Patient is here from [facility].  Per the staff at the SNF, the patient started complaining of NVD at approximately 2200.

## 2011-08-05 NOTE — ED Notes (Signed)
Advised MD that the patient is having more frequent PVCs.

## 2011-08-05 NOTE — ED Notes (Signed)
Patient shows occasional PVCs on the monitor.

## 2011-08-05 NOTE — ED Provider Notes (Signed)
History     CSN: [id number]  Arrival date & time 08/05/11  0027   First MD Initiated Contact with Patient 08/05/11 [id number]      Chief Complaint  Patient presents with  . Nausea  . Emesis  . Diarrhea   Level V caveat for dementia (Consider location/radiation/quality/duration/timing/severity/associated sxs/prior treatment) HPI  Per family patient was out with him today at lunchtime and she tripped and fell. They were not aware that she hit her head. They state she seemed fine afterwards. This evening her nursing home staff was called for complaints of headache and chest pain in patient feeling hot. Family states they think she was gagging without vomiting and she also had some rectal incontinence. Patient's lying with her eyes closed when I awaken her she states she's having a headache she does not think her chest is hurting anymore. Family states is her unusual complaints for her. Patient states the front of her head hurts.  PCP Dr. Sigmund Hazel Cardiologist [facility] Dr.Croitora  Past Medical History  Diagnosis Date  . Dementia   . Hypothyroid   . GERD (gastroesophageal reflux disease)   . Campath-induced atrial fibrillation   . Osteoporosis   . Depression   . Bladder cancer   . Breast cancer     No past surgical history on file. Pacemaker  No family history on file.  History  Substance Use Topics  . Smoking status: Not on file  . Smokeless tobacco: Not on file  . Alcohol Use: No   lives in assisted living  OB History    Grav Para Term Preterm Abortions TAB SAB Ect Mult Living                  Review of Systems  Unable to perform ROS: Dementia    Allergies  Codeine and Sulfa antibiotics  Home Medications   Current Outpatient Rx  Name Route Sig Dispense Refill  . CALTRATE 600+D PO Oral Take 2 tablets by mouth daily.      Marland Kitchen DIGOXIN 0.125 MG PO TABS Oral Take 125 mcg by mouth daily.      . FUROSEMIDE 40 MG PO TABS Oral Take 40 mg by  mouth daily.      . IBANDRONATE SODIUM 150 MG PO TABS Oral Take 150 mg by mouth every 30 (thirty) days. Take in the morning with a full glass of water, on an empty stomach, and do not take anything else by mouth or lie down for the next 30 min.     Marland Kitchen LEVOTHYROXINE SODIUM 125 MCG PO TABS Oral Take 125 mcg by mouth daily.      Marland Kitchen METOPROLOL TARTRATE 25 MG PO TABS Oral Take 25 mg by mouth daily.      Carma Leaven M PLUS PO TABS Oral Take 1 tablet by mouth daily.      Marland Kitchen PANTOPRAZOLE SODIUM 40 MG PO TBEC Oral Take 40 mg by mouth daily.      Marland Kitchen POTASSIUM CHLORIDE CRYS ER 10 MEQ PO TBCR Oral Take 10 mEq by mouth daily.      . SERTRALINE HCL 50 MG PO TABS Oral Take 50 mg by mouth daily.      Marland Kitchen VALSARTAN-HYDROCHLOROTHIAZIDE 160-25 MG PO TABS Oral Take 1 tablet by mouth daily.      Marland Kitchen CALAN SR PO Oral Take 300 mg by mouth daily.      coumadin   BP 149/66  Pulse 83  Temp(Src) 97.4 F (36.3  C) (Oral)  Resp 17  SpO2 97% Vital signs normal     Physical Exam  Nursing note and vitals reviewed. Constitutional: She appears well-developed and well-nourished.  Non-toxic appearance. She does not appear ill. No distress.       Sleeping easily awakened  HENT:  Head: Normocephalic and atraumatic.  Right Ear: External ear normal.  Left Ear: External ear normal.  Nose: Nose normal. No mucosal edema or rhinorrhea.  Mouth/Throat: Oropharynx is clear and moist and mucous membranes are normal. No dental abscesses or uvula swelling.  Eyes: Conjunctivae and EOM are normal. Pupils are equal, round, and reactive to light.  Neck: Normal range of motion and full passive range of motion without pain. Neck supple.  Cardiovascular: Normal rate, regular rhythm and normal heart sounds.  Exam reveals no gallop and no friction rub.   No murmur heard. Pulmonary/Chest: Effort normal and breath sounds normal. No respiratory distress. She has no wheezes. She has no rhonchi. She has no rales. She exhibits no tenderness and no  crepitus.  Abdominal: Soft. Normal appearance and bowel sounds are normal. She exhibits no distension. There is no tenderness. There is no rebound and no guarding.  Musculoskeletal: Normal range of motion. She exhibits no edema and no tenderness.       Moves all extremities well.   Neurological: She is alert. She has normal strength. No cranial nerve deficit.  Skin: Skin is warm, dry and intact. No rash noted. No erythema. No pallor.  Psychiatric: She has a normal mood and affect. Her speech is normal and behavior is normal. Her mood appears not anxious.    ED Course  Procedures (including critical care time)  Patient given morphine IV and Zofran IV. She relates her headache and her chest pain or gone. She was given IV potassium 10 mEq x2 for her hypokalemia. She was also started started on oral potassium. Family is here and is aware of her test results.  Results for orders placed during the hospital encounter of 08/05/11  PROTIME-INR      Component Value Range   Prothrombin Time 34.8 (*) 11.6 - 15.2 (seconds)   INR 3.39 (*) 0.00 - 1.49   APTT      Component Value Range   aPTT 38 (*) 24 - 37 (seconds)  CBC      Component Value Range   WBC 12.4 (*) 4.0 - 10.5 (K/uL)   RBC 3.78 (*) 3.87 - 5.11 (MIL/uL)   Hemoglobin 12.3  12.0 - 15.0 (g/dL)   HCT 96.0  45.4 - 09.8 (%)   MCV 96.8  78.0 - 100.0 (fL)   MCH 32.5  26.0 - 34.0 (pg)   MCHC 33.6  30.0 - 36.0 (g/dL)   RDW 11.9  14.7 - 82.9 (%)   Platelets 336  150 - 400 (K/uL)  DIFFERENTIAL      Component Value Range   Neutrophils Relative 78 (*) 43 - 77 (%)   Neutro Abs 9.6 (*) 1.7 - 7.7 (K/uL)   Lymphocytes Relative 15  12 - 46 (%)   Lymphs Abs 1.8  0.7 - 4.0 (K/uL)   Monocytes Relative 7  3 - 12 (%)   Monocytes Absolute 0.9  0.1 - 1.0 (K/uL)   Eosinophils Relative 1  0 - 5 (%)   Eosinophils Absolute 0.1  0.0 - 0.7 (K/uL)   Basophils Relative 0  0 - 1 (%)   Basophils Absolute 0.0  0.0 - 0.1 (K/uL)  COMPREHENSIVE METABOLIC PANEL  Component Value Range   Sodium 140  135 - 145 (mEq/L)   Potassium 2.6 (*) 3.5 - 5.1 (mEq/L)   Chloride 100  96 - 112 (mEq/L)   CO2 27  19 - 32 (mEq/L)   Glucose, Bld 176 (*) 70 - 99 (mg/dL)   BUN 12  6 - 23 (mg/dL)   Creatinine, Ser 6.57  0.50 - 1.10 (mg/dL)   Calcium 8.9  8.4 - 84.6 (mg/dL)   Total Protein 7.0  6.0 - 8.3 (g/dL)   Albumin 3.4 (*) 3.5 - 5.2 (g/dL)   AST 22  0 - 37 (U/L)   ALT 14  0 - 35 (U/L)   Alkaline Phosphatase 107  39 - 117 (U/L)   Total Bilirubin 0.3  0.3 - 1.2 (mg/dL)   GFR calc non Af Amer 76 (*) >90 (mL/min)   GFR calc Af Amer 88 (*) >90 (mL/min)  DIGOXIN LEVEL      Component Value Range   Digoxin Level 0.7 (*) 0.8 - 2.0 (ng/mL)  POCT I-STAT TROPONIN I      Component Value Range   Troponin i, poc 0.00  0.00 - 0.08 (ng/mL)   Comment 3           POCT I-STAT TROPONIN I      Component Value Range   Troponin i, poc 0.00  0.00 - 0.08 (ng/mL)   Comment 3             Laboratory interpretation all normal except hypokalemia, leukocytosis, and over therapeutic Coumadin   Ct Head Wo Contrast  08/05/2011  *RADIOLOGY REPORT*  Clinical Data: Fall, headache  CT HEAD WITHOUT CONTRAST  Technique:  Contiguous axial images were obtained from the base of the skull through the vertex without contrast.  Comparison: 03/15/2007  Findings: Prominence of the sulci, cisterns, and ventricles, in keeping with volume loss involving the cerebrum and cerebellum. There are subcortical and periventricular white matter hypodensities, a nonspecific finding most often seen with chronic microangiopathic changes.  There is no evidence for acute hemorrhage, overt hydrocephalus, mass lesion, or abnormal extra-axial fluid collection.  No definite CT evidence for acute cortical based (large artery) infarction. The visualized paranasal sinuses and mastoid air cells are predominately clear.  No displaced calvarial fracture.  IMPRESSION: Volume loss and white matter changes.  No definite acute  intracranial abnormality.  Original Report Authenticated By: Waneta Martins, M.D.   Dg Chest Port 1 View  08/05/2011  *RADIOLOGY REPORT*  Clinical Data: 78 year old female with chest pain.  PORTABLE CHEST - 1 VIEW  Comparison: 11/05/2005  Findings: Cardiomegaly again identified. Mild bibasilar opacities may represent atelectasis or airspace disease/pneumonia. A right pacemaker is unchanged. There is no evidence of pleural effusions or pneumothorax. Surgical clips overlying the left axilla no evidence of lower thoracic vertebral augmentation noted.  IMPRESSION: Mild bilateral lower lung opacities - question atelectasis versus pneumonia.  Cardiomegaly.  Original Report Authenticated By: Rosendo Gros, M.D.      Date: 08/05/2011  Rate: 79  Rhythm: normal sinus rhythm  QRS Axis: right  Intervals: normal  ST/T Wave abnormalities: nonspecific ST/T changes  Conduction Disutrbances:left bundle branch block  Narrative Interpretation:   Old EKG Reviewed: unchanged from 03/11/2004    Diagnoses that have been ruled out:  Diagnoses that are still under consideration:  Final diagnoses:  Headache  Chest pain  Warfarin-induced coagulopathy  Hypokalemia with normal acid-base balance   New Prescriptions   POTASSIUM CHLORIDE (K-DUR) 10 MEQ TABLET  Take 2 tablets (20 mEq total) by mouth 2 (two) times daily.   Plan discharge   Devoria Albe, MD, FACEP      MDM          Ward Givens, MD 08/05/11 [phone]

## 2012-01-15 HISTORY — PX: US ECHOCARDIOGRAPHY: HXRAD669

## 2012-04-09 ENCOUNTER — Ambulatory Visit (HOSPITAL_COMMUNITY)
Admission: RE | Admit: 2012-04-09 | Discharge: 2012-04-09 | Disposition: A | Discharge status: Home / Self Care | Payer: Medicare Other | Source: Ambulatory Visit | Attending: Family Medicine | Admitting: Family Medicine

## 2012-04-09 DIAGNOSIS — M7989 Other specified soft tissue disorders: Secondary | ICD-10-CM

## 2012-04-09 DIAGNOSIS — R52 Pain, unspecified: Secondary | ICD-10-CM

## 2012-04-09 DIAGNOSIS — R7989 Other specified abnormal findings of blood chemistry: Secondary | ICD-10-CM

## 2012-04-09 DIAGNOSIS — M79609 Pain in unspecified limb: Secondary | ICD-10-CM

## 2012-04-09 DIAGNOSIS — R791 Abnormal coagulation profile: Secondary | ICD-10-CM | POA: Insufficient documentation

## 2012-04-09 DIAGNOSIS — R609 Edema, unspecified: Secondary | ICD-10-CM

## 2012-04-09 NOTE — Progress Notes (Signed)
*  PRELIMINARY RESULTS* Vascular Ultrasound Right lower extremity venous duplex has been completed.  Preliminary findings: no evidence of DVT. Right Baker's cyst.   Called report to Dr. Hyacinth Meeker.  Farrel Demark, RDMS, RVT  04/09/2012, 11:30 AM

## 2012-08-07 ENCOUNTER — Other Ambulatory Visit: Payer: Self-pay | Admitting: Cardiology

## 2012-08-07 ENCOUNTER — Ambulatory Visit
Admission: RE | Admit: 2012-08-07 | Discharge: 2012-08-07 | Disposition: A | Discharge status: Home / Self Care | Payer: Medicare Other | Source: Ambulatory Visit | Attending: Cardiology | Admitting: Cardiology

## 2012-08-07 DIAGNOSIS — R0602 Shortness of breath: Secondary | ICD-10-CM

## 2012-10-17 ENCOUNTER — Ambulatory Visit: Payer: Self-pay | Admitting: Cardiovascular Disease

## 2012-10-17 DIAGNOSIS — Z7901 Long term (current) use of anticoagulants: Secondary | ICD-10-CM | POA: Insufficient documentation

## 2012-10-17 DIAGNOSIS — I4891 Unspecified atrial fibrillation: Secondary | ICD-10-CM

## 2012-12-15 ENCOUNTER — Ambulatory Visit (INDEPENDENT_AMBULATORY_CARE_PROVIDER_SITE_OTHER): Payer: Self-pay | Admitting: Pharmacist Clinician (PhC)/ Clinical Pharmacy Specialist

## 2012-12-15 DIAGNOSIS — Z7901 Long term (current) use of anticoagulants: Secondary | ICD-10-CM

## 2012-12-15 DIAGNOSIS — I4891 Unspecified atrial fibrillation: Secondary | ICD-10-CM

## 2013-01-06 ENCOUNTER — Encounter: Payer: Self-pay | Admitting: Cardiovascular Disease

## 2013-01-12 ENCOUNTER — Ambulatory Visit (INDEPENDENT_AMBULATORY_CARE_PROVIDER_SITE_OTHER): Payer: Self-pay | Admitting: Pharmacist Clinician (PhC)/ Clinical Pharmacy Specialist

## 2013-01-12 DIAGNOSIS — I4891 Unspecified atrial fibrillation: Secondary | ICD-10-CM

## 2013-01-12 DIAGNOSIS — Z7901 Long term (current) use of anticoagulants: Secondary | ICD-10-CM

## 2013-01-27 ENCOUNTER — Encounter: Payer: Self-pay | Admitting: Cardiovascular Disease

## 2013-01-27 ENCOUNTER — Ambulatory Visit (INDEPENDENT_AMBULATORY_CARE_PROVIDER_SITE_OTHER): Payer: Medicare Other | Admitting: Physician Assistant

## 2013-01-27 ENCOUNTER — Encounter: Payer: Self-pay | Admitting: Physician Assistant

## 2013-01-27 VITALS — BP 116/70 | HR 100 | Resp 16 | Ht 66.0 in | Wt 149.0 lb

## 2013-01-27 DIAGNOSIS — I4891 Unspecified atrial fibrillation: Secondary | ICD-10-CM

## 2013-01-27 DIAGNOSIS — Z950 Presence of cardiac pacemaker: Secondary | ICD-10-CM | POA: Insufficient documentation

## 2013-01-27 DIAGNOSIS — G309 Alzheimer's disease, unspecified: Secondary | ICD-10-CM | POA: Insufficient documentation

## 2013-01-27 DIAGNOSIS — Z45018 Encounter for adjustment and management of other part of cardiac pacemaker: Secondary | ICD-10-CM

## 2013-01-27 DIAGNOSIS — Z79899 Other long term (current) drug therapy: Secondary | ICD-10-CM

## 2013-01-27 DIAGNOSIS — D689 Coagulation defect, unspecified: Secondary | ICD-10-CM

## 2013-01-27 DIAGNOSIS — I447 Left bundle-branch block, unspecified: Secondary | ICD-10-CM | POA: Insufficient documentation

## 2013-01-27 DIAGNOSIS — R5383 Other fatigue: Secondary | ICD-10-CM

## 2013-01-27 DIAGNOSIS — Z01812 Encounter for preprocedural laboratory examination: Secondary | ICD-10-CM

## 2013-01-27 DIAGNOSIS — Z45010 Encounter for checking and testing of cardiac pacemaker pulse generator [battery]: Secondary | ICD-10-CM

## 2013-01-27 DIAGNOSIS — R5381 Other malaise: Secondary | ICD-10-CM

## 2013-01-27 DIAGNOSIS — F0280 Dementia in other diseases classified elsewhere without behavioral disturbance: Secondary | ICD-10-CM | POA: Insufficient documentation

## 2013-01-27 LAB — PACEMAKER DEVICE OBSERVATION

## 2013-01-27 NOTE — Patient Instructions (Addendum)
New pacemaker implant will be scheduled with Dr. Salena Saner at the next appt.  Please make sure that INR is 2.5 or below for procedure.

## 2013-01-27 NOTE — Progress Notes (Signed)
Date:  01/27/2013   ID:  Jerl Santos, DOB [birth year], MRN [id number]  PCP:  Neldon Labella, MD  Primary Cardiologist:  Croitoru     History of Present Illness: Heidi Harrell is a 79 y.o. female  resides at [facility] and has a history of permanent atrial fibrillation on Coumadin, single-chamber permanent pacemaker, hypertension, Alzheimer's disease. Ejection fraction as of June 2013 was 40-50%. Other than some dyspnea on exertion patient appears well compensated and asymptomatic.  The patient currently denies nausea, vomiting, fever, chest pain, shortness of breath, orthopnea, dizziness, PND, cough, congestion, abdominal pain, hematochezia, melena, lower extremity edema.  Interrogation of the patient's single chamber permanent pacemaker indicates it has reached elective replacement interval. She is paced approximately 9% of the time previous interrogation she was pacing approximately 14% of the time.   Wt Readings from Last 3 Encounters:  01/27/13 149 lb (67.586 kg)     Past Medical History  Diagnosis Date  . Dementia   . Hypothyroid   . GERD (gastroesophageal reflux disease)   . Atrial fibrillation   . Osteoporosis   . Depression   . Bladder cancer   . Breast cancer     Current Outpatient Prescriptions  Medication Sig Dispense Refill  . Calcium Carbonate-Vitamin D (CALTRATE 600+D PO) Take 2 tablets by mouth daily.        . furosemide (LASIX) 40 MG tablet Take 40 mg by mouth daily. 1-2 daily prn      . HYDROcodone-acetaminophen (NORCO) 10-325 MG per tablet Take 1 tablet by mouth every 8 (eight) hours as needed for pain.      Marland Kitchen ibandronate (BONIVA) 150 MG tablet Take 150 mg by mouth every 30 (thirty) days. Take in the morning with a full glass of water, on an empty stomach, and do not take anything else by mouth or lie down for the next 30 min.      Marland Kitchen levothyroxine (SYNTHROID, LEVOTHROID) 125 MCG tablet Take 125 mcg by mouth daily.        Marland Kitchen losartan (COZAAR) 50 MG  tablet Take 50 mg by mouth daily.        . metoprolol tartrate (LOPRESSOR) 25 MG tablet Take 25 mg by mouth 2 (two) times daily.       . Multiple Vitamins-Minerals (MULTIVITAMINS THER. W/MINERALS) TABS Take 1 tablet by mouth daily.        . pantoprazole (PROTONIX) 40 MG tablet Take 40 mg by mouth daily.       . polyethylene glycol (MIRALAX / GLYCOLAX) packet Take 17 g by mouth daily.        . potassium chloride (K-DUR,KLOR-CON) 10 MEQ tablet Take 10 mEq by mouth daily.        . sertraline (ZOLOFT) 50 MG tablet Take 50 mg by mouth daily.        . Verapamil HCl (CALAN SR PO) Take 300 mg by mouth daily.        Marland Kitchen warfarin (COUMADIN) 2.5 MG tablet Take 2.5-3.75 mg by mouth See admin instructions. Takes one tablet (2.5mg ) on Monday and Friday. Takes 1.5 tablets (3.75mg ) on Tues, Wed, Thurs, Sat and Sun       . digoxin (LANOXIN) 0.125 MG tablet Take 125 mcg by mouth daily.        . potassium chloride (K-DUR) 10 MEQ tablet Take 2 tablets (20 mEq total) by mouth 2 (two) times daily.  20 tablet  0   No current facility-administered medications for this  visit.    Allergies:    Allergies  Allergen Reactions  . Codeine Nausea And Vomiting  . Sulfa Antibiotics     unknown    Social History:  The patient  reports that she does not drink alcohol.   ROS:  Please see the history of present illness.  All other systems reviewed and negative.   PHYSICAL EXAM: VS:  BP 116/70  Pulse 100  Resp 16  Ht 5\' 6"  (1.676 m)  Wt 149 lb (67.586 kg)  BMI 24.06 kg/m2 Well nourished, well developed, in no acute distress HEENT: Pupils are equal round react to light accommodation extraocular movements are intact.  Neck: no JVDNo cervical lymphadenopathy. Cardiac: Irregular rate and rhythm with soft 1/6 systolic murmur Lungs:  clear to auscultation bilaterally, no wheezing, rhonchi or rales Ext: no lower extremity edema.  2+ radial and dorsalis pedis pulses. Skin: warm and dry Neuro:  Grossly normal  EKG:  Atrial  fibrillation rate 92 beats per minute axis deviation and left bundle branch block    ASSESSMENT AND PLAN:  Problem List Items Addressed This Visit   Pacemaker at end of battery life     Patient will be scheduled for generator change out with Dr. Royann Shivers. Target Coumadin level to 2.0.    Left bundle branch block   Atrial fibrillation - Primary   Relevant Orders      INR/PT      PTT   Alzheimer disease    Other Visit Diagnoses   Encounter for long-term (current) use of other medications        Relevant Orders       Comp Met (CMET)    Other and unspecified coagulation defects        Other malaise and fatigue        Relevant Orders       TSH       CBC    Pre-procedure lab exam        Relevant Orders       DG Chest 2 View

## 2013-01-27 NOTE — Assessment & Plan Note (Addendum)
Patient will be scheduled for generator change out with Dr. Royann Shivers. Target Coumadin level to 2.0.

## 2013-01-27 NOTE — Progress Notes (Signed)
Patient has reached ERI. Will schedule for a gen change.

## 2013-01-28 ENCOUNTER — Other Ambulatory Visit: Payer: Self-pay | Admitting: Cardiovascular Disease

## 2013-01-28 ENCOUNTER — Encounter: Payer: Self-pay | Admitting: Physician Assistant

## 2013-01-28 ENCOUNTER — Ambulatory Visit (INDEPENDENT_AMBULATORY_CARE_PROVIDER_SITE_OTHER): Payer: Self-pay | Admitting: Pharmacist Clinician (PhC)/ Clinical Pharmacy Specialist

## 2013-01-28 DIAGNOSIS — Z7901 Long term (current) use of anticoagulants: Secondary | ICD-10-CM

## 2013-01-28 DIAGNOSIS — Z45010 Encounter for checking and testing of cardiac pacemaker pulse generator [battery]: Secondary | ICD-10-CM

## 2013-01-28 DIAGNOSIS — Z01812 Encounter for preprocedural laboratory examination: Secondary | ICD-10-CM

## 2013-01-28 DIAGNOSIS — I4891 Unspecified atrial fibrillation: Secondary | ICD-10-CM

## 2013-01-28 LAB — PACEMAKER DEVICE OBSERVATION
BRDY-0002RV: 65 {beats}/min
VENTRICULAR PACING PM: 8.9

## 2013-02-02 ENCOUNTER — Ambulatory Visit (INDEPENDENT_AMBULATORY_CARE_PROVIDER_SITE_OTHER): Payer: Self-pay | Admitting: Pharmacist Clinician (PhC)/ Clinical Pharmacy Specialist

## 2013-02-02 ENCOUNTER — Telehealth: Payer: Self-pay | Admitting: Cardiovascular Disease

## 2013-02-02 DIAGNOSIS — I4891 Unspecified atrial fibrillation: Secondary | ICD-10-CM

## 2013-02-02 DIAGNOSIS — Z7901 Long term (current) use of anticoagulants: Secondary | ICD-10-CM

## 2013-02-02 MED ORDER — PHYTONADIONE 5 MG PO TABS: 2.5000 mg | ORAL_TABLET | Freq: Once | ORAL | Status: DC

## 2013-02-02 NOTE — Progress Notes (Signed)
Per Dottie at [facility], warfarin dose was not held, although order was put in for INR today.  Pt already received warfarin dose today.  Will give vit K 2.5mg  x 1 due to fall risk/age and that she has already had warfarin today.  Repeat INR Thursday

## 2013-02-04 ENCOUNTER — Ambulatory Visit
Admission: RE | Admit: 2013-02-04 | Discharge: 2013-02-04 | Disposition: A | Discharge status: Home / Self Care | Payer: Medicare Other | Source: Ambulatory Visit | Attending: Physician Assistant | Admitting: Physician Assistant

## 2013-02-04 DIAGNOSIS — Z01812 Encounter for preprocedural laboratory examination: Secondary | ICD-10-CM

## 2013-02-04 LAB — COMPREHENSIVE METABOLIC PANEL
Albumin: 4.2 g/dL (ref 3.5–5.2)
BUN: 16 mg/dL (ref 6–23)
CO2: 29 mEq/L (ref 19–32)
Calcium: 9.3 mg/dL (ref 8.4–10.5)
Chloride: 100 mEq/L (ref 96–112)
Glucose, Bld: 125 mg/dL — ABNORMAL HIGH (ref 70–99)
Potassium: 4.4 mEq/L (ref 3.5–5.3)

## 2013-02-04 LAB — CBC
Hemoglobin: 12.8 g/dL (ref 12.0–15.0)
MCH: 33.3 pg (ref 26.0–34.0)
MCHC: 35 g/dL (ref 30.0–36.0)
Platelets: 337 10*3/uL (ref 150–400)

## 2013-02-04 LAB — APTT: aPTT: 35 seconds (ref 24–37)

## 2013-02-04 LAB — PROTIME-INR
INR: 2.14 — ABNORMAL HIGH (ref <1.50)
Prothrombin Time: 23.2 seconds — ABNORMAL HIGH (ref 11.6–15.2)

## 2013-02-05 ENCOUNTER — Ambulatory Visit (INDEPENDENT_AMBULATORY_CARE_PROVIDER_SITE_OTHER): Payer: Self-pay | Admitting: Pharmacist Clinician (PhC)/ Clinical Pharmacy Specialist

## 2013-02-05 ENCOUNTER — Telehealth: Payer: Self-pay | Admitting: Cardiovascular Disease

## 2013-02-05 ENCOUNTER — Encounter (HOSPITAL_COMMUNITY): Payer: Self-pay | Admitting: Pharmacy Technician

## 2013-02-05 DIAGNOSIS — I4891 Unspecified atrial fibrillation: Secondary | ICD-10-CM

## 2013-02-05 DIAGNOSIS — Z7901 Long term (current) use of anticoagulants: Secondary | ICD-10-CM

## 2013-02-05 NOTE — Telephone Encounter (Signed)
We need med list from whatever facility she is at.  Corine Shelter PA-C 02/05/2013 5:32 PM

## 2013-02-05 NOTE — Telephone Encounter (Signed)
Returned call and spoke w/ Thurston Hole, pt's daughter.  Stated [facility] called and wanted to know what meds pt is taking.  Informed list RN has in Epic is what is viewable to [facility].  Verbalized understanding and stated pt's facility list does not match up with the list pt was given at her last visit.  Stated coumadin, losartan and digoxin are not on list at facility, but she knows pt is given coumadin b/c they called her yesterday about getting her pro-time checked.  Reviewed paper chart and Epi to compare lists.  Digoxin was dc'd June 2013 and Losartan dc'd September 2013 per paper chart.  Anne informed Dr. Royann Shivers is out of the office and an Extender will be notified to review for further instructions.  Pt verbalized understanding and agreed w/ plan.  Understands it may take at least 24hrs to receive a response.  Pt's appt for pacer battery change is on July 15th.  Message forwarded to Hinda Glatter, PA-C for further instructions.

## 2013-02-05 NOTE — Telephone Encounter (Signed)
See anticoag encounter - has generator change scheduled for Tues 7/15

## 2013-02-05 NOTE — Telephone Encounter (Signed)
Heidi Harrell is calling pt/inr results ; pt is 23.2 and inr is 2.14   Thanks

## 2013-02-05 NOTE — Telephone Encounter (Signed)
Mrs. Psychologist, clinical) is wanting to verify the medications that Dr.Croitoru has her mother on for her pacemaker battery change next week and [facility] is asking for this information.

## 2013-02-05 NOTE — Telephone Encounter (Signed)
Heidi Harrell is calling because she has not received a faxed that was to be sent over to them for the coumadin orders ...   Thanks

## 2013-02-06 NOTE — Telephone Encounter (Signed)
Med list received.  Paper chart w/ list on Extender cart for review.  Message forwarded to Hinda Glatter, PA-C for further instructions.

## 2013-02-06 NOTE — Telephone Encounter (Signed)
Returned call.  Left message to use med list from facility and call back on Monday if any questions.

## 2013-02-06 NOTE — Telephone Encounter (Signed)
Meds reviewed, she is not on Lanoxin and this is fine. Other medications as listed.

## 2013-02-06 NOTE — Telephone Encounter (Signed)
Returned call and spoke w/ Thurston Hole, pt's daughter.  Stated she compared the facility list to our list and what the pharmacy is sending to the facilty.  Stated in September 2013 Losartan was stopped and metoprolol was increased.  Stated the digoxin was stopped in April or May of 2013.  Asked daughter to have a copy of the facility's med list faxed to office for review and update.  Daughter agreed and fax number given (nurse station).  Will have faxed today.

## 2013-02-09 ENCOUNTER — Encounter (HOSPITAL_COMMUNITY): Payer: Self-pay | Admitting: Pharmacy Technician

## 2013-02-09 MED ORDER — CEFAZOLIN SODIUM-DEXTROSE 2-3 GM-% IV SOLR
2.0000 g | INTRAVENOUS | Status: AC
  Filled 2013-02-09: qty 50

## 2013-02-09 MED ORDER — SODIUM CHLORIDE 0.9 % IR SOLN
80.0000 mg | Status: AC
  Filled 2013-02-09: qty 2

## 2013-02-09 MED ORDER — CHLORHEXIDINE GLUCONATE 4 % EX LIQD
60.0000 mL | Freq: Once | CUTANEOUS | Status: DC
  Filled 2013-02-09: qty 60

## 2013-02-09 MED ORDER — SODIUM CHLORIDE 0.9 % IJ SOLN: 3.0000 mL | INTRAMUSCULAR | Status: DC | PRN

## 2013-02-09 MED ORDER — SODIUM CHLORIDE 0.9 % IV SOLN
INTRAVENOUS | Status: DC
  Administered 2013-02-10: 11:00:00 via INTRAVENOUS

## 2013-02-10 ENCOUNTER — Ambulatory Visit (HOSPITAL_COMMUNITY)
Admission: RE | Admit: 2013-02-10 | Discharge: 2013-02-10 | Disposition: A | Discharge status: Home / Self Care | Payer: Medicare Other | Source: Ambulatory Visit | Attending: Cardiovascular Disease | Admitting: Cardiovascular Disease

## 2013-02-10 ENCOUNTER — Encounter (HOSPITAL_COMMUNITY)
Admission: RE | Disposition: A | Discharge status: Home / Self Care | Payer: Self-pay | Source: Ambulatory Visit | Attending: Cardiovascular Disease

## 2013-02-10 DIAGNOSIS — Z79899 Other long term (current) drug therapy: Secondary | ICD-10-CM | POA: Insufficient documentation

## 2013-02-10 DIAGNOSIS — I495 Sick sinus syndrome: Secondary | ICD-10-CM | POA: Insufficient documentation

## 2013-02-10 DIAGNOSIS — E039 Hypothyroidism, unspecified: Secondary | ICD-10-CM | POA: Insufficient documentation

## 2013-02-10 DIAGNOSIS — F0390 Unspecified dementia without behavioral disturbance: Secondary | ICD-10-CM | POA: Insufficient documentation

## 2013-02-10 DIAGNOSIS — Z882 Allergy status to sulfonamides status: Secondary | ICD-10-CM | POA: Insufficient documentation

## 2013-02-10 DIAGNOSIS — Z45010 Encounter for checking and testing of cardiac pacemaker pulse generator [battery]: Secondary | ICD-10-CM

## 2013-02-10 DIAGNOSIS — M810 Age-related osteoporosis without current pathological fracture: Secondary | ICD-10-CM | POA: Insufficient documentation

## 2013-02-10 DIAGNOSIS — Z8551 Personal history of malignant neoplasm of bladder: Secondary | ICD-10-CM | POA: Insufficient documentation

## 2013-02-10 DIAGNOSIS — Z885 Allergy status to narcotic agent status: Secondary | ICD-10-CM | POA: Insufficient documentation

## 2013-02-10 DIAGNOSIS — I447 Left bundle-branch block, unspecified: Secondary | ICD-10-CM | POA: Insufficient documentation

## 2013-02-10 DIAGNOSIS — I498 Other specified cardiac arrhythmias: Secondary | ICD-10-CM

## 2013-02-10 DIAGNOSIS — Z853 Personal history of malignant neoplasm of breast: Secondary | ICD-10-CM | POA: Insufficient documentation

## 2013-02-10 DIAGNOSIS — I4891 Unspecified atrial fibrillation: Secondary | ICD-10-CM | POA: Insufficient documentation

## 2013-02-10 DIAGNOSIS — K219 Gastro-esophageal reflux disease without esophagitis: Secondary | ICD-10-CM | POA: Insufficient documentation

## 2013-02-10 DIAGNOSIS — Z7901 Long term (current) use of anticoagulants: Secondary | ICD-10-CM | POA: Insufficient documentation

## 2013-02-10 DIAGNOSIS — Z45018 Encounter for adjustment and management of other part of cardiac pacemaker: Secondary | ICD-10-CM | POA: Insufficient documentation

## 2013-02-10 HISTORY — PX: PERMANENT PACEMAKER GENERATOR CHANGE: SHX6022

## 2013-02-10 HISTORY — PX: PACEMAKER GENERATOR CHANGE: SHX5481

## 2013-02-10 SURGERY — PACEMAKER GENERATOR CHANGE
Anesthesia: LOCAL

## 2013-02-10 MED ORDER — LIDOCAINE HCL (PF) 1 % IJ SOLN
INTRAMUSCULAR | Status: AC
  Filled 2013-02-10: qty 60

## 2013-02-10 MED ORDER — MUPIROCIN 2 % EX OINT
TOPICAL_OINTMENT | Freq: Once | CUTANEOUS | Status: AC
  Filled 2013-02-10: qty 22

## 2013-02-10 MED ORDER — HEPARIN (PORCINE) IN NACL 2-0.9 UNIT/ML-% IJ SOLN
INTRAMUSCULAR | Status: AC
  Filled 2013-02-10: qty 500

## 2013-02-10 MED ORDER — MUPIROCIN 2 % EX OINT
TOPICAL_OINTMENT | CUTANEOUS | Status: AC
  Administered 2013-02-10: 1 via NASAL
  Filled 2013-02-10: qty 22

## 2013-02-10 NOTE — H&P (Signed)
Date of Initial H&P: 01/27/13  History reviewed, patient examined, no change in status, stable for surgery. Dual chamber pacemaker at [facility], here for generator change. Implanted for sick sinus sd. and tachy-brady sd. in 2005, she now has AF with slow VR. This procedure has been fully reviewed with the patient and written informed consent has been obtained. Thurmon Fair, MD, [facility] and [facility] Center [phone] office [phone] pager

## 2013-02-10 NOTE — Op Note (Addendum)
Procedure report  Procedure performed:  1. Dual chamber pacemaker generator changeout  2. Light sedation  Reason for procedure:  1. Device generator at elective replacement interval  2. Symptomatic bradycardia 3. Atrial fibrillation with slow ventricular response Procedure performed by:  Thurmon Fair, MD  Complications:  None  Estimated blood loss:  <5 mL  Medications administered during procedure:  Ancef 2 g intravenously, lidocaine 1% 30 mL locally,  Device details:   New Generator Medtronic Adapta S model number [id number], serial number [id number] Right atrial lead (chronic) Medtronic, model number F9272065, serial [id number] (implanted 09/20/2003, device now programmed VVI) Right ventricular lead (chronic)  Medtronic, model number [id number], serial number [id number] (implanted 09/20/2003)  Explanted generator Medtronic Enpulse,  model number [id number], serial number  [id number] (implanted 09/20/2003)  Procedure details:  After the risks and benefits of the procedure were discussed the patient provided informed consent. She was brought to the cardiac catheter lab in the fasting state. The patient was prepped and draped in usual sterile fashion. Local anesthesia with 1% lidocaine was administered to to the right infraclavicular area. A 5-6cm horizontal incision was made parallel with and 2-3 cm caudal to the right clavicle. An older scar was seen closer to the left clavicle. Using  sharp and blunt dissection the prepectoral pocket was opened carefully to avoid injury to the loops of chronic leads. Extensive dissection was not necessary. The device was explanted. The pocket was carefully inspected for hemostasis and flushed with copious amounts of antibiotic solution.  The leads were disconnected from the old generator and testing of the lead parameters later showed excellent values. The new generator was connected to the chronic leads, with appropriate pacing noted.   The entire system  was then carefully inserted in the pocket with care been taking that the leads and device assumed a comfortable position without pressure on the incision. Great care was taken that the leads be located deep to the generator. The pocket was then closed in layers using 2 layers of 2-0 Vicryl and cutaneous staples after which a sterile dressing was applied.   At the end of the procedure the following lead parameters were encountered:  Right ventricular lead sensed R waves  25 mV, impedance 763 ohms, threshold 0.5 at 0.5 ms pulse width.  Thurmon Fair, MD, [facility] and [facility] Center [phone] office [phone] pager

## 2013-02-16 ENCOUNTER — Ambulatory Visit (INDEPENDENT_AMBULATORY_CARE_PROVIDER_SITE_OTHER): Payer: Self-pay | Admitting: Pharmacist Clinician (PhC)/ Clinical Pharmacy Specialist

## 2013-02-16 DIAGNOSIS — Z7901 Long term (current) use of anticoagulants: Secondary | ICD-10-CM

## 2013-02-16 DIAGNOSIS — I4891 Unspecified atrial fibrillation: Secondary | ICD-10-CM

## 2013-02-21 ENCOUNTER — Encounter: Payer: Self-pay | Admitting: *Deleted

## 2013-02-23 ENCOUNTER — Encounter: Payer: Self-pay | Admitting: Cardiovascular Disease

## 2013-02-23 ENCOUNTER — Ambulatory Visit (INDEPENDENT_AMBULATORY_CARE_PROVIDER_SITE_OTHER): Payer: Medicare Other | Admitting: Cardiovascular Disease

## 2013-02-23 VITALS — BP 120/82 | HR 84 | Resp 20 | Ht 66.0 in | Wt 145.5 lb

## 2013-02-23 DIAGNOSIS — Z45018 Encounter for adjustment and management of other part of cardiac pacemaker: Secondary | ICD-10-CM

## 2013-02-23 DIAGNOSIS — Z45010 Encounter for checking and testing of cardiac pacemaker pulse generator [battery]: Secondary | ICD-10-CM

## 2013-02-23 NOTE — Assessment & Plan Note (Signed)
The surgical site is healing nicely. Steri-Strips are still in place. There is no evidence of pocket hematoma, drainage, redness and she has not had fever or chills. She'll be avoiding tub baths until the skin is completely sealed and the Steri-Strips have fallen off. We will reenroll her in remote pacemaker checks every 3 months.

## 2013-02-23 NOTE — Progress Notes (Signed)
Patient ID: Heidi Harrell, female   DOB: 03/09/1919, 79 y.o.   MRN: [id number]     Reason for office visit Pacemaker generator change followup  Heidi Harrell returns roughly 2 weeks after elective pacemaker generator change. The site is healing nicely. She has not developed any evidence of pocket hematoma or infection. Steri-Strips are still in place. The skin appears to be nicely healed. She has received her new transmitter for remote pacemaker checks    Allergies  Allergen Reactions  . Codeine Nausea And Vomiting  . Sulfa Antibiotics     unknown  . Toprol Xl (Metoprolol Tartrate)     Dizziness     Current Outpatient Prescriptions  Medication Sig Dispense Refill  . furosemide (LASIX) 40 MG tablet Take 40 mg by mouth daily. Takes an additional tablet if weight is over 145 lbs.      Marland Kitchen HYDROcodone-acetaminophen (NORCO) 10-325 MG per tablet Take 1 tablet by mouth every 8 (eight) hours as needed for pain.      Marland Kitchen ibandronate (BONIVA) 150 MG tablet Take 150 mg by mouth every 30 (thirty) days. Take in the morning with a full glass of water, on an empty stomach, and do not take anything else by mouth or lie down for the next 30 min.      . levalbuterol (XOPENEX HFA) 45 MCG/ACT inhaler Inhale 1-2 puffs into the lungs every 4 (four) hours as needed for wheezing.      Marland Kitchen levothyroxine (SYNTHROID, LEVOTHROID) 125 MCG tablet Take 125 mcg by mouth daily.        . metoprolol (LOPRESSOR) 50 MG tablet Take 75 mg by mouth 2 (two) times daily.       . Multiple Vitamins-Minerals (MULTIVITAMINS THER. W/MINERALS) TABS Take 1 tablet by mouth daily.        . pantoprazole (PROTONIX) 40 MG tablet Take 40 mg by mouth daily.       . polyethylene glycol (MIRALAX / GLYCOLAX) packet Take 4.25 g by mouth every other day.       . potassium chloride SA (K-DUR,KLOR-CON) 20 MEQ tablet Take 20 mEq by mouth daily.      . promethazine (PHENERGAN) 25 MG tablet Take 25 mg by mouth every 6 (six) hours as needed for nausea.       . sertraline (ZOLOFT) 50 MG tablet Take 50 mg by mouth daily.        . Verapamil HCl (CALAN SR PO) Take 300 mg by mouth daily.        Marland Kitchen warfarin (COUMADIN) 2.5 MG tablet Take 2.5-3.75 mg by mouth daily. 3.75 mg on Monday, Wednesday and fridays . 2.5 mg all other days.       No current facility-administered medications for this visit.    Past Medical History  Diagnosis Date  . Dementia   . Hypothyroid   . GERD (gastroesophageal reflux disease)   . Atrial fibrillation   . Osteoporosis   . Depression   . Bladder cancer   . Breast cancer   . Hypertensive heart disease   . Presence of permanent cardiac pacemaker 09/20/2003    Medtronic EnPulse  . LBBB (left bundle branch block)     Past Surgical History  Procedure Laterality Date  . Permanent pacemaker insertion  09/20/2003    Medtronic EnPulse  . Cardiac catheterization  09/20/2003    patent coronary arteries  . US echocardiography  01/15/2012    EF 45-50%,AL severley dilated,mod. mitral annular ca+,mild MR,mod TR,AOV mod. sclerotic  .  Permanent pacemaker generator change  02/10/2013    Medtronic    Family History  Problem Relation Age of Onset  . Stroke Father     History   Social History  . Marital Status: Widowed    Spouse Name: N/A    Number of Children: N/A  . Years of Education: N/A   Occupational History  . Not on file.   Social History Main Topics  . Smoking status: Never Smoker   . Smokeless tobacco: Not on file  . Alcohol Use: No  . Drug Use: Not on file  . Sexually Active: Not on file   Other Topics Concern  . Not on file   Social History Narrative  . No narrative on file    Review of systems: The patient specifically denies any chest pain at rest or with exertion, dyspnea at rest or with exertion, orthopnea, paroxysmal nocturnal dyspnea, syncope, palpitations, focal neurological deficits, intermittent claudication, lower extremity edema, unexplained weight gain, cough, hemoptysis or  wheezing.  The patient also denies abdominal pain, nausea, vomiting, dysphagia, diarrhea, constipation, polyuria, polydipsia, dysuria, hematuria, frequency, urgency, abnormal bleeding or bruising, fever, chills, unexpected weight changes, mood swings, change in skin or hair texture, change in voice quality, auditory or visual problems, allergic reactions or rashes, new musculoskeletal complaints other than usual "aches and pains".   PHYSICAL EXAM BP 120/82  Pulse 84  Resp 20  Ht 5\' 6"  (1.676 m)  Wt 145 lb 8 oz (65.998 kg)  BMI 23.5 kg/m2  General: Alert, oriented x3, no distress Head: no evidence of trauma, PERRL, EOMI, no exophtalmos or lid lag, no myxedema, no xanthelasma; normal ears, nose and oropharynx Neck: normal jugular venous pulsations and no hepatojugular reflux; brisk carotid pulses without delay and no carotid bruits Chest: clear to auscultation, no signs of consolidation by percussion or palpation, normal fremitus, symmetrical and full respiratory excursions; Steri-Strips still overlie the right subclavian pacemaker surgical site but there is no evidence of redness swelling or warmth Cardiovascular: normal position and quality of the apical impulse, irregular rhythm, normal first and paradoxically split second heart sounds, no murmurs, rubs or gallops Abdomen: no tenderness or distention, no masses by palpation, no abnormal pulsatility or arterial bruits, normal bowel sounds, no hepatosplenomegaly Extremities: no clubbing, cyanosis or edema; 2+ radial, ulnar and brachial pulses bilaterally; 2+ right femoral, posterior tibial and dorsalis pedis pulses; 2+ left femoral, posterior tibial and dorsalis pedis pulses; no subclavian or femoral bruits Neurological: grossly nonfocal   BMET    Component Value Date/Time   NA 139 02/04/2013 1339   K 4.4 02/04/2013 1339   CL 100 02/04/2013 1339   CO2 29 02/04/2013 1339   GLUCOSE 125* 02/04/2013 1339   BUN 16 02/04/2013 1339   CREATININE 0.86  02/04/2013 1339   CREATININE 0.65 08/05/2011 0122   CALCIUM 9.3 02/04/2013 1339   GFRNONAA 76* 08/05/2011 0122   GFRAA 88* 08/05/2011 0122     ASSESSMENT AND PLAN Pacemaker with recent generator change The surgical site is healing nicely. Steri-Strips are still in place. There is no evidence of pocket hematoma, drainage, redness and she has not had fever or chills. She'll be avoiding tub baths until the skin is completely sealed and the Steri-Strips have fallen off. We will reenroll her in remote pacemaker checks every 3 months.  Junious Silk, MD, [facility] [phone] office [phone] pager

## 2013-02-23 NOTE — Patient Instructions (Addendum)
Remote monitoring is used to monitor your Pacemaker of ICD from home. This monitoring reduces the number of office visits required to check your device to one time per year. It allows [place] to keep an eye on the functioning of your device to ensure it is working properly. You are scheduled for a device check from home on 05-27-2013. You may send your transmission at any time that day. If you have a wireless device, the transmission will be sent automatically. After your physician reviews your transmission, you will receive a postcard with your next transmission date.  Your physician recommends that you schedule a follow-up appointment in: 12 MONTHS

## 2013-03-02 ENCOUNTER — Ambulatory Visit (INDEPENDENT_AMBULATORY_CARE_PROVIDER_SITE_OTHER): Payer: Self-pay | Admitting: Pharmacist

## 2013-03-02 DIAGNOSIS — Z7901 Long term (current) use of anticoagulants: Secondary | ICD-10-CM

## 2013-03-02 DIAGNOSIS — I4891 Unspecified atrial fibrillation: Secondary | ICD-10-CM

## 2013-03-04 ENCOUNTER — Other Ambulatory Visit: Payer: Self-pay

## 2013-03-13 LAB — PROTIME-INR: INR: 2.3 — AB (ref <=1.1)

## 2013-03-17 ENCOUNTER — Telehealth: Payer: Self-pay | Admitting: Pharmacist Clinician (PhC)/ Clinical Pharmacy Specialist

## 2013-03-17 ENCOUNTER — Ambulatory Visit (INDEPENDENT_AMBULATORY_CARE_PROVIDER_SITE_OTHER): Payer: Self-pay | Admitting: Pharmacist Clinician (PhC)/ Clinical Pharmacy Specialist

## 2013-03-17 DIAGNOSIS — Z7901 Long term (current) use of anticoagulants: Secondary | ICD-10-CM

## 2013-03-17 DIAGNOSIS — I4891 Unspecified atrial fibrillation: Secondary | ICD-10-CM

## 2013-03-17 NOTE — Telephone Encounter (Signed)
No Message-wants to give PT Results

## 2013-03-17 NOTE — Telephone Encounter (Signed)
Returned call to [place].  Stated she faxed the results, but hasn't heard anything.  Informed Kristin, PharmD, will call once reviewed as she is seeing patients right now and my not have had time to respond.    Marcelino Duster agreed to give results: PT 23.8 INR 2.2.  Informed Belenda Cruise will be notified.  Verbalized understanding.  Message forwarded to K. Alvstad, PharmD.

## 2013-04-10 ENCOUNTER — Ambulatory Visit (INDEPENDENT_AMBULATORY_CARE_PROVIDER_SITE_OTHER): Payer: Medicare Other | Admitting: Pharmacist Clinician (PhC)/ Clinical Pharmacy Specialist

## 2013-04-10 DIAGNOSIS — Z7901 Long term (current) use of anticoagulants: Secondary | ICD-10-CM

## 2013-04-10 DIAGNOSIS — I4891 Unspecified atrial fibrillation: Secondary | ICD-10-CM

## 2013-05-08 ENCOUNTER — Ambulatory Visit (INDEPENDENT_AMBULATORY_CARE_PROVIDER_SITE_OTHER): Payer: Medicare Other | Admitting: Pharmacist Clinician (PhC)/ Clinical Pharmacy Specialist

## 2013-05-08 DIAGNOSIS — Z7901 Long term (current) use of anticoagulants: Secondary | ICD-10-CM

## 2013-05-08 DIAGNOSIS — I4891 Unspecified atrial fibrillation: Secondary | ICD-10-CM

## 2013-05-27 ENCOUNTER — Ambulatory Visit (INDEPENDENT_AMBULATORY_CARE_PROVIDER_SITE_OTHER): Payer: Medicare Other

## 2013-05-27 DIAGNOSIS — I4891 Unspecified atrial fibrillation: Secondary | ICD-10-CM

## 2013-05-27 DIAGNOSIS — I447 Left bundle-branch block, unspecified: Secondary | ICD-10-CM

## 2013-05-27 LAB — PACEMAKER DEVICE OBSERVATION

## 2013-06-03 LAB — REMOTE PACEMAKER DEVICE
BMOD-0003RV: 30
BRDY-0002RV: 60 {beats}/min
RV LEAD AMPLITUDE: 16 mv
RV LEAD IMPEDENCE PM: 792 Ohm

## 2013-06-04 ENCOUNTER — Other Ambulatory Visit: Payer: Self-pay

## 2013-06-12 LAB — PROTIME-INR: INR: 2.3 — AB (ref <=1.1)

## 2013-06-15 ENCOUNTER — Ambulatory Visit (INDEPENDENT_AMBULATORY_CARE_PROVIDER_SITE_OTHER): Payer: Medicare Other | Admitting: Pharmacist Clinician (PhC)/ Clinical Pharmacy Specialist

## 2013-06-15 ENCOUNTER — Encounter: Payer: Self-pay | Admitting: *Deleted

## 2013-06-15 DIAGNOSIS — I4891 Unspecified atrial fibrillation: Secondary | ICD-10-CM

## 2013-06-15 DIAGNOSIS — Z7901 Long term (current) use of anticoagulants: Secondary | ICD-10-CM

## 2013-06-22 ENCOUNTER — Other Ambulatory Visit: Payer: Self-pay | Admitting: Cardiology

## 2013-07-10 LAB — PROTIME-INR: INR: 1.6 — AB (ref <=1.1)

## 2013-07-15 ENCOUNTER — Telehealth: Payer: Self-pay | Admitting: Cardiovascular Disease

## 2013-07-15 ENCOUNTER — Other Ambulatory Visit: Payer: Self-pay | Admitting: Cardiovascular Disease

## 2013-07-15 ENCOUNTER — Ambulatory Visit (INDEPENDENT_AMBULATORY_CARE_PROVIDER_SITE_OTHER): Payer: Medicare Other | Admitting: Pharmacist Clinician (PhC)/ Clinical Pharmacy Specialist

## 2013-07-15 DIAGNOSIS — Z7901 Long term (current) use of anticoagulants: Secondary | ICD-10-CM

## 2013-07-15 DIAGNOSIS — I4891 Unspecified atrial fibrillation: Secondary | ICD-10-CM

## 2013-07-15 NOTE — Telephone Encounter (Signed)
Rx was sent to pharmacy electronically. 

## 2013-07-15 NOTE — Telephone Encounter (Signed)
Belenda Cruise, PharmD, notified and stated she just spoke w/ Enrique Sack at [facility].  Stated they have said they have faxed office several times w/o response, but nothing in office.    Returned call and pt verified x 2 w/ EC, Osa Craver.  Informed message received and pharmacist working on this now.  Informed fax not received in office, per pharmacist, and she is communicating w/ staff to take care of this for pt.  Verbalized understanding.

## 2013-07-15 NOTE — Telephone Encounter (Signed)
[facility] called her about coumadin . When  [facility] cked on Friday was high.  Faxed info to [place] and has not heard back.  Please call as soon as possible.

## 2013-07-20 ENCOUNTER — Ambulatory Visit (INDEPENDENT_AMBULATORY_CARE_PROVIDER_SITE_OTHER): Payer: Medicare Other | Admitting: Pharmacist Clinician (PhC)/ Clinical Pharmacy Specialist

## 2013-07-20 DIAGNOSIS — Z7901 Long term (current) use of anticoagulants: Secondary | ICD-10-CM

## 2013-07-20 DIAGNOSIS — I4891 Unspecified atrial fibrillation: Secondary | ICD-10-CM

## 2013-08-07 LAB — PROTIME-INR: INR: 6.4 — AB (ref <=1.1)

## 2013-08-10 ENCOUNTER — Ambulatory Visit (INDEPENDENT_AMBULATORY_CARE_PROVIDER_SITE_OTHER): Payer: Medicare Other | Admitting: Pharmacist Clinician (PhC)/ Clinical Pharmacy Specialist

## 2013-08-10 DIAGNOSIS — I4891 Unspecified atrial fibrillation: Secondary | ICD-10-CM

## 2013-08-10 DIAGNOSIS — Z7901 Long term (current) use of anticoagulants: Secondary | ICD-10-CM

## 2013-08-12 ENCOUNTER — Ambulatory Visit: Payer: Medicare Other | Admitting: Pharmacist Clinician (PhC)/ Clinical Pharmacy Specialist

## 2013-08-12 ENCOUNTER — Ambulatory Visit (INDEPENDENT_AMBULATORY_CARE_PROVIDER_SITE_OTHER): Payer: Medicare Other | Admitting: Pharmacist Clinician (PhC)/ Clinical Pharmacy Specialist

## 2013-08-12 VITALS — BP 120/70 | HR 72

## 2013-08-12 DIAGNOSIS — I4891 Unspecified atrial fibrillation: Secondary | ICD-10-CM

## 2013-08-12 DIAGNOSIS — Z7901 Long term (current) use of anticoagulants: Secondary | ICD-10-CM

## 2013-08-12 LAB — POCT INR: INR: 1.2

## 2013-08-31 ENCOUNTER — Ambulatory Visit (INDEPENDENT_AMBULATORY_CARE_PROVIDER_SITE_OTHER): Payer: Medicare Other | Admitting: *Deleted

## 2013-08-31 DIAGNOSIS — I4891 Unspecified atrial fibrillation: Secondary | ICD-10-CM

## 2013-09-08 LAB — MDC_IDC_ENUM_SESS_TYPE_REMOTE
Brady Statistic RV Percent Paced: 19.6 %
Lead Channel Pacing Threshold Pulse Width: 0.4 ms
Lead Channel Setting Pacing Amplitude: 2 V
Lead Channel Setting Pacing Pulse Width: 0.4 ms
Lead Channel Setting Sensing Sensitivity: 5.6 mV
MDC IDC MSMT LEADCHNL RV PACING THRESHOLD AMPLITUDE: 0.625 V
MDC IDC MSMT LEADCHNL RV SENSING INTR AMPL: 22.4 mV

## 2013-09-18 LAB — POCT INR: INR: 1.9

## 2013-09-21 ENCOUNTER — Ambulatory Visit (INDEPENDENT_AMBULATORY_CARE_PROVIDER_SITE_OTHER): Payer: Medicare Other | Admitting: Pharmacist Clinician (PhC)/ Clinical Pharmacy Specialist

## 2013-09-21 DIAGNOSIS — I4891 Unspecified atrial fibrillation: Secondary | ICD-10-CM

## 2013-09-21 DIAGNOSIS — Z7901 Long term (current) use of anticoagulants: Secondary | ICD-10-CM

## 2013-09-28 ENCOUNTER — Encounter: Payer: Self-pay | Admitting: *Deleted

## 2013-10-01 ENCOUNTER — Encounter: Payer: Self-pay | Admitting: Cardiovascular Disease

## 2013-10-02 LAB — PROTIME-INR
INR: 2 — AB (ref 0.9–1.1)
INR: 2 — AB (ref <=1.1)

## 2013-10-05 ENCOUNTER — Ambulatory Visit (INDEPENDENT_AMBULATORY_CARE_PROVIDER_SITE_OTHER): Payer: Medicare Other | Admitting: Pharmacist Clinician (PhC)/ Clinical Pharmacy Specialist

## 2013-10-05 ENCOUNTER — Telehealth: Payer: Self-pay | Admitting: Pharmacist Clinician (PhC)/ Clinical Pharmacy Specialist

## 2013-10-05 DIAGNOSIS — I4891 Unspecified atrial fibrillation: Secondary | ICD-10-CM

## 2013-10-05 DIAGNOSIS — Z7901 Long term (current) use of anticoagulants: Secondary | ICD-10-CM

## 2013-10-05 NOTE — Telephone Encounter (Signed)
She said she faxed over [facility] to you.Please fax results to [phone].

## 2013-10-16 ENCOUNTER — Ambulatory Visit: Payer: Medicare Other | Admitting: Pharmacist Clinician (PhC)/ Clinical Pharmacy Specialist

## 2013-10-16 LAB — PROTIME-INR: INR: 2.6 — AB (ref 0.9–1.1)

## 2013-10-21 ENCOUNTER — Ambulatory Visit: Payer: Medicare Other | Admitting: Pharmacist Clinician (PhC)/ Clinical Pharmacy Specialist

## 2013-11-12 ENCOUNTER — Telehealth: Payer: Self-pay | Admitting: Cardiovascular Disease

## 2013-11-12 LAB — POCT INR: INR: 3

## 2013-11-12 NOTE — Telephone Encounter (Signed)
Who did she see today?  She lives at [facility] and they check her there and send fax to me with results.  I'll send them a message tomorrow to have them continue with same dose.  Thx

## 2013-11-12 NOTE — Telephone Encounter (Signed)
Heidi Harrell was brought in today and had PT/INR checked by lab, before realizing that this is monitored in our office.   INR 3.0 Protime 35.7  Will defer to [name] to advise  Patient is resident at [facility]

## 2013-11-12 NOTE — Telephone Encounter (Signed)
She saw Kathyrn Lass - I guess they ordered labs and they were drawn and they drew a PT/INR too without realizing it was typically done elsewhere. Thanks.

## 2013-11-13 ENCOUNTER — Ambulatory Visit (INDEPENDENT_AMBULATORY_CARE_PROVIDER_SITE_OTHER): Payer: Medicare Other | Admitting: Pharmacist Clinician (PhC)/ Clinical Pharmacy Specialist

## 2013-11-13 DIAGNOSIS — I4891 Unspecified atrial fibrillation: Secondary | ICD-10-CM

## 2013-11-13 DIAGNOSIS — Z7901 Long term (current) use of anticoagulants: Secondary | ICD-10-CM

## 2013-11-13 NOTE — Telephone Encounter (Signed)
See anticoag note

## 2013-11-23 ENCOUNTER — Ambulatory Visit (INDEPENDENT_AMBULATORY_CARE_PROVIDER_SITE_OTHER): Payer: Medicare Other | Admitting: Pharmacist Clinician (PhC)/ Clinical Pharmacy Specialist

## 2013-11-23 DIAGNOSIS — I4891 Unspecified atrial fibrillation: Secondary | ICD-10-CM

## 2013-11-23 DIAGNOSIS — Z7901 Long term (current) use of anticoagulants: Secondary | ICD-10-CM

## 2013-11-23 LAB — POCT INR: INR: 2.4

## 2013-12-01 ENCOUNTER — Encounter: Payer: Self-pay | Admitting: Cardiovascular Disease

## 2013-12-01 ENCOUNTER — Ambulatory Visit (INDEPENDENT_AMBULATORY_CARE_PROVIDER_SITE_OTHER): Payer: Medicare Other | Admitting: *Deleted

## 2013-12-01 DIAGNOSIS — I4891 Unspecified atrial fibrillation: Secondary | ICD-10-CM

## 2013-12-06 LAB — MDC_IDC_ENUM_SESS_TYPE_REMOTE
Battery Impedance: 155 Ohm
Battery Remaining Longevity: 118 mo
Battery Voltage: 2.79 V
Brady Statistic RV Percent Paced: 24 %
Date Time Interrogation Session: 20150505154946
Lead Channel Impedance Value: 729 Ohm
Lead Channel Pacing Threshold Amplitude: 0.625 V
Lead Channel Pacing Threshold Pulse Width: 0.4 ms
Lead Channel Setting Sensing Sensitivity: 5.6 mV
MDC IDC MSMT LEADCHNL RA IMPEDANCE VALUE: 67 Ohm
MDC IDC MSMT LEADCHNL RV SENSING INTR AMPL: 16 mV
MDC IDC SET LEADCHNL RV PACING AMPLITUDE: 2 V
MDC IDC SET LEADCHNL RV PACING PULSEWIDTH: 0.4 ms

## 2013-12-10 ENCOUNTER — Encounter: Payer: Self-pay | Admitting: Cardiology

## 2013-12-11 NOTE — Progress Notes (Signed)
Remote pacemaker transmission.   

## 2013-12-18 LAB — PROTIME-INR: INR: 2.6 — AB (ref 0.9–1.1)

## 2013-12-23 ENCOUNTER — Ambulatory Visit (INDEPENDENT_AMBULATORY_CARE_PROVIDER_SITE_OTHER): Payer: Medicare Other | Admitting: Pharmacist Clinician (PhC)/ Clinical Pharmacy Specialist

## 2013-12-23 DIAGNOSIS — I4891 Unspecified atrial fibrillation: Secondary | ICD-10-CM

## 2013-12-23 DIAGNOSIS — Z7901 Long term (current) use of anticoagulants: Secondary | ICD-10-CM

## 2014-01-15 LAB — PROTIME-INR: INR: 2.3 — AB (ref 0.9–1.1)

## 2014-01-20 ENCOUNTER — Ambulatory Visit (INDEPENDENT_AMBULATORY_CARE_PROVIDER_SITE_OTHER): Payer: Medicare Other | Admitting: Pharmacist Clinician (PhC)/ Clinical Pharmacy Specialist

## 2014-01-20 DIAGNOSIS — Z7901 Long term (current) use of anticoagulants: Secondary | ICD-10-CM

## 2014-02-05 LAB — PROTIME-INR
INR: 1.9 — AB (ref 0.9–1.1)
INR: 1.9 — AB (ref <=1.1)

## 2014-02-08 ENCOUNTER — Ambulatory Visit (INDEPENDENT_AMBULATORY_CARE_PROVIDER_SITE_OTHER): Payer: Medicare Other | Admitting: Pharmacist

## 2014-02-08 DIAGNOSIS — Z7901 Long term (current) use of anticoagulants: Secondary | ICD-10-CM

## 2014-02-08 DIAGNOSIS — I4891 Unspecified atrial fibrillation: Secondary | ICD-10-CM

## 2014-02-16 ENCOUNTER — Encounter: Payer: Medicare Other | Admitting: Cardiovascular Disease

## 2014-02-23 ENCOUNTER — Encounter: Payer: Self-pay | Admitting: Cardiovascular Disease

## 2014-02-23 ENCOUNTER — Ambulatory Visit (INDEPENDENT_AMBULATORY_CARE_PROVIDER_SITE_OTHER): Payer: Medicare Other | Admitting: Cardiovascular Disease

## 2014-02-23 VITALS — BP 130/72 | HR 82 | Resp 16 | Ht 62.0 in | Wt 152.3 lb

## 2014-02-23 DIAGNOSIS — Z950 Presence of cardiac pacemaker: Secondary | ICD-10-CM

## 2014-02-23 DIAGNOSIS — I5042 Chronic combined systolic (congestive) and diastolic (congestive) heart failure: Secondary | ICD-10-CM

## 2014-02-23 DIAGNOSIS — I4891 Unspecified atrial fibrillation: Secondary | ICD-10-CM

## 2014-02-23 DIAGNOSIS — I482 Chronic atrial fibrillation, unspecified: Secondary | ICD-10-CM

## 2014-02-23 LAB — MDC_IDC_ENUM_SESS_TYPE_INCLINIC
Date Time Interrogation Session: 20150728131425
Lead Channel Impedance Value: 67 Ohm
Lead Channel Impedance Value: 746 Ohm
Lead Channel Pacing Threshold Amplitude: 0.5 V
Lead Channel Pacing Threshold Pulse Width: 0.4 ms
Lead Channel Setting Pacing Amplitude: 2 V
MDC IDC MSMT BATTERY IMPEDANCE: 179 Ohm
MDC IDC MSMT BATTERY REMAINING LONGEVITY: 115 mo
MDC IDC MSMT BATTERY VOLTAGE: 2.79 V
MDC IDC MSMT LEADCHNL RV SENSING INTR AMPL: 22.4 mV
MDC IDC SET LEADCHNL RV PACING PULSEWIDTH: 0.4 ms
MDC IDC SET LEADCHNL RV SENSING SENSITIVITY: 5.6 mV
MDC IDC STAT BRADY RV PERCENT PACED: 26 %

## 2014-02-23 NOTE — Patient Instructions (Signed)
Remote monitoring is used to monitor your pacemaker from home. This monitoring reduces the number of office visits required to check your device to one time per year. It allows [place] to keep an eye on the functioning of your device to ensure it is working properly. You are scheduled for a device check from home on 05-27-2014. You may send your transmission at any time that day. If you have a wireless device, the transmission will be sent automatically. After your physician reviews your transmission, you will receive a postcard with your next transmission date.  Your physician recommends that you schedule a follow-up appointment in: 12 months with Dr.Croitoru

## 2014-02-23 NOTE — Progress Notes (Signed)
Patient ID: Heidi Harrell, female   DOB: 1919/07/23, 80 y.o.   MRN: [id number]      Reason for office visit Permanent atrial fibrillation, pacemaker followup, cardiomyopathy  Heidi Harrell is a 80 y.o. Resident at [facility] with a history of permanent atrial fibrillation on Coumadin, single-chamber permanent pacemaker (dual chamber device implanted 2005, generator change out programmed VVIR July 2014, Medtronic Adapta) , hypertension, Alzheimer's disease, remote history of left breast cancer. She has a mild cardiomyopathy, most likely nonischemic. Ejection fraction as of June 2013 was 45-50%. She had normal coronary arteries by cardiac catheterization in 1998 and a normal nuclear perfusion study in 2002. The perfusion study in 2005 showed a fixed apical defect, possibly RV pacing related. Other than some mild dyspnea on exertion patient appears well compensated and asymptomatic.   For a period of time she appeared to have more issues with volume retention and lower extremity edema and exertional dyspnea. This was probably related to insufficient ventricular rate control. On the other hand, excessive treatment with ventricular rate control medications lead to a high-frequency ventricular pacing. We seem to have reached a reasonable compromise between these extremes with ventricular pacing occurring roughly 30% of the time and very rare high ventricular rates. She still has occasional problems with lower extremity edema.   Allergies  Allergen Reactions  . Actonel [Risedronate Sodium]   . Codeine Nausea And Vomiting  . Sulfa Antibiotics     unknown  . Toprol Xl [Metoprolol Tartrate]     Dizziness     Current Outpatient Prescriptions  Medication Sig Dispense Refill  . acetaminophen (TYLENOL) 325 MG tablet Take 650 mg by mouth every 6 (six) hours as needed.      . Calcium-Vitamin D (CALTRATE 600 PLUS-VIT D PO) Take by mouth daily.      . fluticasone (FLONASE) 50 MCG/ACT nasal spray Place  2 sprays into both nostrils daily.      . furosemide (LASIX) 40 MG tablet Take 40 mg by mouth daily. Takes an additional tablet if weight is over 145 lbs.      . ibandronate (BONIVA) 150 MG tablet Take 150 mg by mouth every 30 (thirty) days. Take in the morning with a full glass of water, on an empty stomach, and do not take anything else by mouth or lie down for the next 30 min.      . levalbuterol (XOPENEX HFA) 45 MCG/ACT inhaler Inhale 1-2 puffs into the lungs every 4 (four) hours as needed for wheezing.      Marland Kitchen levothyroxine (SYNTHROID, LEVOTHROID) 125 MCG tablet Take 125 mcg by mouth daily.        Marland Kitchen LORazepam (ATIVAN) 0.5 MG tablet Take 0.25 mg by mouth every 6 (six) hours as needed for anxiety.      . metoprolol (LOPRESSOR) 50 MG tablet TAKE 1 AND 1/2 TABLETS (75MG ) BY MOUTH TWICE DAILY.  90 tablet  7  . Multiple Vitamins-Minerals (MULTIVITAMINS THER. W/MINERALS) TABS Take 1 tablet by mouth daily.        . pantoprazole (PROTONIX) 40 MG tablet Take 40 mg by mouth daily.       . polyethylene glycol (MIRALAX / GLYCOLAX) packet Take 4.25 g by mouth every other day.       . potassium chloride SA (K-DUR,KLOR-CON) 20 MEQ tablet Take 20 mEq by mouth daily.      . promethazine (PHENERGAN) 25 MG tablet Take 25 mg by mouth every 6 (six) hours as needed for nausea.      Marland Kitchen  sertraline (ZOLOFT) 50 MG tablet Take 50 mg by mouth daily.        . Verapamil HCl (CALAN SR PO) Take 300 mg by mouth daily.        Marland Kitchen warfarin (COUMADIN) 2.5 MG tablet Take 2.5-3.75 mg by mouth daily. 3.75 mg on Monday, Wednesday and fridays . 2.5 mg all other days.       No current facility-administered medications for this visit.    Past Medical History  Diagnosis Date  . Dementia   . Hypothyroid   . GERD (gastroesophageal reflux disease)   . Atrial fibrillation   . Osteoporosis   . Depression   . Bladder cancer   . Breast cancer   . Hypertensive heart disease   . Presence of permanent cardiac pacemaker 09/20/2003     Medtronic EnPulse  . LBBB (left bundle branch block)     Past Surgical History  Procedure Laterality Date  . Permanent pacemaker insertion  09/20/2003    Medtronic EnPulse  . Cardiac catheterization  09/20/2003    patent coronary arteries  . US echocardiography  01/15/2012    EF 45-50%,AL severley dilated,mod. mitral annular ca+,mild MR,mod TR,AOV mod. sclerotic  . Permanent pacemaker generator change  02/10/2013    Medtronic    Family History  Problem Relation Age of Onset  . Stroke Father     History   Social History  . Marital Status: Widowed    Spouse Name: N/A    Number of Children: N/A  . Years of Education: N/A   Occupational History  . Not on file.   Social History Main Topics  . Smoking status: Never Smoker   . Smokeless tobacco: Not on file  . Alcohol Use: No  . Drug Use: Not on file  . Sexual Activity: Not on file   Other Topics Concern  . Not on file   Social History Narrative  . No narrative on file    Review of systems: The patient specifically denies any chest pain at rest or with exertion, dyspnea at rest or with exertion, orthopnea, paroxysmal nocturnal dyspnea, syncope, palpitations, focal neurological deficits, intermittent claudication, unexplained weight gain, cough, hemoptysis or wheezing.  The patient also denies abdominal pain, nausea, vomiting, dysphagia, diarrhea, constipation, polyuria, polydipsia, dysuria, hematuria, frequency, urgency, abnormal bleeding or bruising, fever, chills, unexpected weight changes, mood swings, change in skin or hair texture, change in voice quality, auditory or visual problems, allergic reactions or rashes, new musculoskeletal complaints other than usual "aches and pains".   PHYSICAL EXAM BP 130/72  Pulse 82  Resp 16  Ht 5\' 2"  (1.575 m)  Wt 152 lb 4.8 oz (69.083 kg)  BMI 27.85 kg/m2  General: Alert, oriented x3, no distress Head: no evidence of trauma, PERRL, EOMI, no exophtalmos or lid lag, no myxedema,  no xanthelasma; normal ears, nose and oropharynx Neck: normal jugular venous pulsations and no hepatojugular reflux; brisk carotid pulses without delay and no carotid bruits Chest: clear to auscultation, no signs of consolidation by percussion or palpation, normal fremitus, symmetrical and full respiratory excursions Cardiovascular: normal position and quality of the apical impulse, irregular rhythm, normal first and second heart sounds, no murmurs, rubs or gallops Abdomen: no tenderness or distention, no masses by palpation, no abnormal pulsatility or arterial bruits, normal bowel sounds, no hepatosplenomegaly Extremities: no clubbing, cyanosis or edema; 2+ radial, ulnar and brachial pulses bilaterally; 2+ right femoral, posterior tibial and dorsalis pedis pulses; 2+ left femoral, posterior tibial and dorsalis pedis pulses;  no subclavian or femoral bruits Neurological: grossly nonfocal   EKG: Background atrial fibrillation, intermittent ventricular pacing, native QRS complex his left bundle branch block with left axis deviation.  Lipid Panel  No results found for this basename: chol, trig, hdl, cholhdl, vldl, ldlcalc    BMET    Component Value Date/Time   NA 139 02/04/2013 1339   K 4.4 02/04/2013 1339   CL 100 02/04/2013 1339   CO2 29 02/04/2013 1339   GLUCOSE 125* 02/04/2013 1339   BUN 16 02/04/2013 1339   CREATININE 0.86 02/04/2013 1339   CREATININE 0.65 08/05/2011 0122   CALCIUM 9.3 02/04/2013 1339   GFRNONAA 76* 08/05/2011 0122   GFRAA 88* 08/05/2011 0122     ASSESSMENT AND PLAN  Atrial fibrillation Appropriate anticoagulation, adequate rate control, no change in medications.  Pacemaker - single chamber Medtronic Normal device function. No reprogramming changes necessary today. Continue [facility] every 3 months and followup in the office in one year  Chronic combined systolic and diastolic heart failure, NYHA class 2 Mild cardiomyopathy, presumed nonischemic. Would be a candidate for cardiac  resynchronization therapy but the patient and her daughter prefer nonaggressive treatment at age 11. Continue current dose of loop diuretics. We'll continue current dose of beta blocker and verapamil, despite negative inotropic effect, since they're providing a good balance between slow and high ventricular response.     Meds ordered this encounter  Medications  . Calcium-Vitamin D (CALTRATE 600 PLUS-VIT D PO)    Sig: Take by mouth daily.  Marland Kitchen acetaminophen (TYLENOL) 325 MG tablet    Sig: Take 650 mg by mouth every 6 (six) hours as needed.  Marland Kitchen LORazepam (ATIVAN) 0.5 MG tablet    Sig: Take 0.25 mg by mouth every 6 (six) hours as needed for anxiety.  . fluticasone (FLONASE) 50 MCG/ACT nasal spray    Sig: Place 2 sprays into both nostrils daily.    Holli Humbles, MD, [place] [phone] office [phone] pager

## 2014-02-23 NOTE — Assessment & Plan Note (Signed)
Normal device function. No reprogramming changes necessary today. Continue [facility] every 3 months and followup in the office in one year

## 2014-02-23 NOTE — Assessment & Plan Note (Signed)
Appropriate anticoagulation, adequate rate control, no change in medications.

## 2014-02-23 NOTE — Assessment & Plan Note (Signed)
Mild cardiomyopathy, presumed nonischemic. Would be a candidate for cardiac resynchronization therapy but the patient and her daughter prefer nonaggressive treatment at age 80. Continue current dose of loop diuretics. We'll continue current dose of beta blocker and verapamil, despite negative inotropic effect, since they're providing a good balance between slow and high ventricular response.

## 2014-02-26 ENCOUNTER — Ambulatory Visit (INDEPENDENT_AMBULATORY_CARE_PROVIDER_SITE_OTHER): Payer: Medicare Other | Admitting: Pharmacist Clinician (PhC)/ Clinical Pharmacy Specialist

## 2014-02-26 DIAGNOSIS — Z7901 Long term (current) use of anticoagulants: Secondary | ICD-10-CM

## 2014-02-26 LAB — PROTIME-INR: INR: 2.3 — AB (ref 0.9–1.1)

## 2014-03-08 ENCOUNTER — Encounter: Payer: Self-pay | Admitting: Cardiovascular Disease

## 2014-03-29 ENCOUNTER — Telehealth: Payer: Self-pay | Admitting: Cardiovascular Disease

## 2014-03-29 NOTE — Telephone Encounter (Signed)
Pansy(nurse) called in wanting to know if it was all right for the pt to wait until 9/04 for her to have her PTINR labs drawn because somehow when she was getting the labs drawn at [facility] the paperwork for those labs were overlooked. She stated that if she can not wait, that she be notified as soon as possible. Please call  Thanks

## 2014-03-29 NOTE — Telephone Encounter (Signed)
Sept 4 will be fine for repeat INR.  Pansy RN notified.

## 2014-04-02 LAB — PROTIME-INR

## 2014-04-03 ENCOUNTER — Telehealth: Payer: Self-pay | Admitting: Physician Assistant

## 2014-04-03 NOTE — Telephone Encounter (Signed)
     I talked to patient's daughter about [name], a patient of Dr. Uvaldo Bristle. She is reporting a glassy eye and a dropping mouth. She was evaluated by EMS and they said she was okay as she was showing no focal deficits. Patient;s daughter wanting reassurance that she made the right decision to not go to the ED. I told her it seemed okay, but to watch her closely.  Angelena Form PA-C  [facility]

## 2014-04-04 ENCOUNTER — Encounter (HOSPITAL_COMMUNITY): Payer: Self-pay | Admitting: Emergency Medicine

## 2014-04-04 ENCOUNTER — Inpatient Hospital Stay (HOSPITAL_COMMUNITY)
Admission: EM | Admit: 2014-04-04 | Discharge: 2014-04-09 | DRG: 062 | Disposition: A | Discharge status: Skilled Nursing Facility | Payer: Medicare Other | Attending: Neurology | Admitting: Neurology

## 2014-04-04 ENCOUNTER — Emergency Department (HOSPITAL_COMMUNITY): Payer: Medicare Other

## 2014-04-04 DIAGNOSIS — F0390 Unspecified dementia without behavioral disturbance: Secondary | ICD-10-CM | POA: Diagnosis present

## 2014-04-04 DIAGNOSIS — I119 Hypertensive heart disease without heart failure: Secondary | ICD-10-CM | POA: Diagnosis present

## 2014-04-04 DIAGNOSIS — Z950 Presence of cardiac pacemaker: Secondary | ICD-10-CM

## 2014-04-04 DIAGNOSIS — I4891 Unspecified atrial fibrillation: Secondary | ICD-10-CM | POA: Diagnosis present

## 2014-04-04 DIAGNOSIS — R471 Dysarthria and anarthria: Secondary | ICD-10-CM | POA: Diagnosis present

## 2014-04-04 DIAGNOSIS — M810 Age-related osteoporosis without current pathological fracture: Secondary | ICD-10-CM | POA: Diagnosis present

## 2014-04-04 DIAGNOSIS — F329 Major depressive disorder, single episode, unspecified: Secondary | ICD-10-CM | POA: Diagnosis present

## 2014-04-04 DIAGNOSIS — Z823 Family history of stroke: Secondary | ICD-10-CM

## 2014-04-04 DIAGNOSIS — Z66 Do not resuscitate: Secondary | ICD-10-CM | POA: Diagnosis present

## 2014-04-04 DIAGNOSIS — Z885 Allergy status to narcotic agent status: Secondary | ICD-10-CM

## 2014-04-04 DIAGNOSIS — Z8551 Personal history of malignant neoplasm of bladder: Secondary | ICD-10-CM | POA: Diagnosis not present

## 2014-04-04 DIAGNOSIS — F3289 Other specified depressive episodes: Secondary | ICD-10-CM | POA: Diagnosis present

## 2014-04-04 DIAGNOSIS — I6350 Cerebral infarction due to unspecified occlusion or stenosis of unspecified cerebral artery: Secondary | ICD-10-CM

## 2014-04-04 DIAGNOSIS — Z8673 Personal history of transient ischemic attack (TIA), and cerebral infarction without residual deficits: Secondary | ICD-10-CM

## 2014-04-04 DIAGNOSIS — R29898 Other symptoms and signs involving the musculoskeletal system: Secondary | ICD-10-CM | POA: Diagnosis present

## 2014-04-04 DIAGNOSIS — I6340 Cerebral infarction due to embolism of unspecified cerebral artery: Principal | ICD-10-CM | POA: Diagnosis present

## 2014-04-04 DIAGNOSIS — F0280 Dementia in other diseases classified elsewhere without behavioral disturbance: Secondary | ICD-10-CM

## 2014-04-04 DIAGNOSIS — E039 Hypothyroidism, unspecified: Secondary | ICD-10-CM | POA: Diagnosis present

## 2014-04-04 DIAGNOSIS — G309 Alzheimer's disease, unspecified: Secondary | ICD-10-CM

## 2014-04-04 DIAGNOSIS — Z7983 Long term (current) use of bisphosphonates: Secondary | ICD-10-CM

## 2014-04-04 DIAGNOSIS — D539 Nutritional anemia, unspecified: Secondary | ICD-10-CM | POA: Diagnosis present

## 2014-04-04 DIAGNOSIS — Z853 Personal history of malignant neoplasm of breast: Secondary | ICD-10-CM | POA: Diagnosis not present

## 2014-04-04 DIAGNOSIS — Z7901 Long term (current) use of anticoagulants: Secondary | ICD-10-CM | POA: Diagnosis not present

## 2014-04-04 DIAGNOSIS — Z888 Allergy status to other drugs, medicaments and biological substances status: Secondary | ICD-10-CM | POA: Diagnosis not present

## 2014-04-04 DIAGNOSIS — I482 Chronic atrial fibrillation, unspecified: Secondary | ICD-10-CM

## 2014-04-04 DIAGNOSIS — R1311 Dysphagia, oral phase: Secondary | ICD-10-CM | POA: Diagnosis present

## 2014-04-04 DIAGNOSIS — R1312 Dysphagia, oropharyngeal phase: Secondary | ICD-10-CM | POA: Diagnosis present

## 2014-04-04 DIAGNOSIS — I447 Left bundle-branch block, unspecified: Secondary | ICD-10-CM | POA: Diagnosis present

## 2014-04-04 DIAGNOSIS — G8190 Hemiplegia, unspecified affecting unspecified side: Secondary | ICD-10-CM | POA: Diagnosis present

## 2014-04-04 DIAGNOSIS — R29810 Facial weakness: Secondary | ICD-10-CM | POA: Diagnosis present

## 2014-04-04 DIAGNOSIS — I639 Cerebral infarction, unspecified: Secondary | ICD-10-CM | POA: Diagnosis present

## 2014-04-04 DIAGNOSIS — Z882 Allergy status to sulfonamides status: Secondary | ICD-10-CM | POA: Diagnosis not present

## 2014-04-04 LAB — COMPREHENSIVE METABOLIC PANEL
ALT: 16 U/L (ref 0–35)
AST: 27 U/L (ref 0–37)
Albumin: 3.5 g/dL (ref 3.5–5.2)
Alkaline Phosphatase: 103 U/L (ref 39–117)
Anion gap: 15 (ref 5–15)
BUN: 22 mg/dL (ref 6–23)
CO2: 26 mEq/L (ref 19–32)
Calcium: 8.9 mg/dL (ref 8.4–10.5)
Chloride: 98 mEq/L (ref 96–112)
Creatinine, Ser: 1.06 mg/dL (ref 0.50–1.10)
GFR calc Af Amer: 51 mL/min — ABNORMAL LOW (ref >90)
GFR calc non Af Amer: 44 mL/min — ABNORMAL LOW (ref >90)
Glucose, Bld: 150 mg/dL — ABNORMAL HIGH (ref 70–99)
Potassium: 2.9 mEq/L — CL (ref 3.7–5.3)
Sodium: 139 mEq/L (ref 137–147)
Total Bilirubin: 0.3 mg/dL (ref 0.3–1.2)
Total Protein: 7.3 g/dL (ref 6.0–8.3)

## 2014-04-04 LAB — URINALYSIS, ROUTINE W REFLEX MICROSCOPIC
Bilirubin Urine: NEGATIVE
Glucose, UA: NEGATIVE mg/dL
KETONES UR: NEGATIVE mg/dL
Leukocytes, UA: NEGATIVE
NITRITE: NEGATIVE
Protein, ur: NEGATIVE mg/dL
Specific Gravity, Urine: 1.011 (ref 1.005–1.030)
Urobilinogen, UA: 0.2 mg/dL (ref 0.0–1.0)
pH: 7 (ref 5.0–8.0)

## 2014-04-04 LAB — URINE MICROSCOPIC-ADD ON

## 2014-04-04 LAB — I-STAT CHEM 8, ED
BUN: 21 mg/dL (ref 6–23)
Calcium, Ion: 1.1 mmol/L — ABNORMAL LOW (ref 1.13–1.30)
Chloride: 99 mEq/L (ref 96–112)
Creatinine, Ser: 1.1 mg/dL (ref 0.50–1.10)
Glucose, Bld: 155 mg/dL — ABNORMAL HIGH (ref 70–99)
HCT: 35 % — ABNORMAL LOW (ref 36.0–46.0)
Hemoglobin: 11.9 g/dL — ABNORMAL LOW (ref 12.0–15.0)
Potassium: 2.6 mEq/L — CL (ref 3.7–5.3)
Sodium: 138 mEq/L (ref 137–147)
TCO2: 27 mmol/L (ref 0–100)

## 2014-04-04 LAB — CBC
HCT: 33.5 % — ABNORMAL LOW (ref 36.0–46.0)
Hemoglobin: 11.2 g/dL — ABNORMAL LOW (ref 12.0–15.0)
MCH: 33.9 pg (ref 26.0–34.0)
MCHC: 33.4 g/dL (ref 30.0–36.0)
MCV: 101.5 fL — AB (ref 78.0–100.0)
Platelets: 314 10*3/uL (ref 150–400)
RBC: 3.3 MIL/uL — ABNORMAL LOW (ref 3.87–5.11)
RDW: 14.1 % (ref 11.5–15.5)
WBC: 6 10*3/uL (ref 4.0–10.5)

## 2014-04-04 LAB — RAPID URINE DRUG SCREEN, HOSP PERFORMED
Amphetamines: NOT DETECTED
BARBITURATES: NOT DETECTED
BENZODIAZEPINES: NOT DETECTED
Cocaine: NOT DETECTED
OPIATES: NOT DETECTED
TETRAHYDROCANNABINOL: NOT DETECTED

## 2014-04-04 LAB — I-STAT TROPONIN, ED: Troponin i, poc: 0.02 ng/mL (ref 0.00–0.08)

## 2014-04-04 LAB — DIFFERENTIAL
Basophils Absolute: 0 10*3/uL (ref 0.0–0.1)
Basophils Relative: 0 % (ref 0–1)
Eosinophils Absolute: 0.2 10*3/uL (ref 0.0–0.7)
Eosinophils Relative: 3 % (ref 0–5)
Lymphocytes Relative: 26 % (ref 12–46)
Lymphs Abs: 1.6 10*3/uL (ref 0.7–4.0)
Monocytes Absolute: 0.7 10*3/uL (ref 0.1–1.0)
Monocytes Relative: 11 % (ref 3–12)
Neutro Abs: 3.6 10*3/uL (ref 1.7–7.7)
Neutrophils Relative %: 60 % (ref 43–77)

## 2014-04-04 LAB — PROTIME-INR
INR: 1.26 (ref 0.00–1.49)
PROTHROMBIN TIME: 15.8 s — AB (ref 11.6–15.2)

## 2014-04-04 LAB — MRSA PCR SCREENING: MRSA by PCR: NEGATIVE

## 2014-04-04 LAB — ETHANOL: Alcohol, Ethyl (B): <11 mg/dL (ref 0–11)

## 2014-04-04 LAB — POTASSIUM
POTASSIUM: 3.5 meq/L — AB (ref 3.7–5.3)
Potassium: 3.4 mEq/L — ABNORMAL LOW (ref 3.7–5.3)

## 2014-04-04 LAB — APTT: aPTT: 29 seconds (ref 24–37)

## 2014-04-04 MED ORDER — STROKE: EARLY STAGES OF RECOVERY BOOK
Freq: Once | Status: DC
  Filled 2014-04-04: qty 1

## 2014-04-04 MED ORDER — PANTOPRAZOLE SODIUM 40 MG IV SOLR
40.0000 mg | Freq: Every day | INTRAVENOUS | Status: DC
  Administered 2014-04-04 – 2014-04-05 (×2): 40 mg via INTRAVENOUS
  Filled 2014-04-04 (×3): qty 40

## 2014-04-04 MED ORDER — HYDROMORPHONE HCL PF 1 MG/ML IJ SOLN
1.0000 mg | INTRAMUSCULAR | Status: DC | PRN
  Administered 2014-04-04: 1 mg via INTRAVENOUS
  Filled 2014-04-04: qty 1

## 2014-04-04 MED ORDER — ACETAMINOPHEN 325 MG PO TABS
650.0000 mg | ORAL_TABLET | ORAL | Status: DC | PRN
  Administered 2014-04-08: 650 mg via ORAL
  Filled 2014-04-04: qty 2

## 2014-04-04 MED ORDER — SODIUM CHLORIDE 0.9 % IV SOLN
INTRAVENOUS | Status: DC
  Administered 2014-04-04 – 2014-04-08 (×6): via INTRAVENOUS

## 2014-04-04 MED ORDER — ALTEPLASE (STROKE) FULL DOSE INFUSION
0.9000 mg/kg | Freq: Once | INTRAVENOUS | Status: AC
  Administered 2014-04-04: 55 mg via INTRAVENOUS
  Filled 2014-04-04: qty 55

## 2014-04-04 MED ORDER — POTASSIUM CHLORIDE 10 MEQ/100ML IV SOLN
10.0000 meq | INTRAVENOUS | Status: AC
  Administered 2014-04-04 (×4): 10 meq via INTRAVENOUS
  Filled 2014-04-04 (×4): qty 100

## 2014-04-04 MED ORDER — ACETAMINOPHEN 650 MG RE SUPP
650.0000 mg | RECTAL | Status: DC | PRN
  Administered 2014-04-05: 650 mg via RECTAL
  Filled 2014-04-04: qty 1

## 2014-04-04 MED ORDER — ONDANSETRON HCL 4 MG/2ML IJ SOLN
4.0000 mg | Freq: Four times a day (QID) | INTRAMUSCULAR | Status: DC | PRN
  Administered 2014-04-04 – 2014-04-06 (×4): 4 mg via INTRAVENOUS
  Filled 2014-04-04 (×5): qty 2

## 2014-04-04 MED ORDER — LABETALOL HCL 5 MG/ML IV SOLN
10.0000 mg | INTRAVENOUS | Status: DC | PRN
  Filled 2014-04-04: qty 4

## 2014-04-04 MED ORDER — CETYLPYRIDINIUM CHLORIDE 0.05 % MT LIQD
7.0000 mL | Freq: Two times a day (BID) | OROMUCOSAL | Status: DC
  Administered 2014-04-05 – 2014-04-09 (×9): 7 mL via OROMUCOSAL

## 2014-04-04 NOTE — Progress Notes (Signed)
eLink Physician-Brief Progress Note Patient Name: Heidi Harrell DOB: Feb 07, 1919 MRN: [id number]   Date of Service  04/04/2014  HPI/Events of Note  80 yo from from nursing center with lt sided weakness and dysarthria with facial droop.  CT head showed dense Rt MCA territory.  She has hx of a fib on coumadin, dementia, HTN.  She was given tPA.  She is DNR.  eICU Interventions  Monitor in ICU.        Sharhonda Atwood 04/04/2014, 6:41 PM

## 2014-04-04 NOTE — Consult Note (Deleted)
Referring Physician: Dr. Leonides Schanz    Chief Complaint: Code stroke  HPI: Heidi Harrell is a 80 y.o. female who resides in the memory care unit at the [facility] in [place] with a history of atrial fibrillation on Coumadin therapy, permanent pacemaker, dementia, and hypertension. Yesterday the patient had a TIA they consisted of a facial droop which lasted approximately 2 minutes according to the patient's daughter. When this resolved she was back to baseline. Today at approximately 1510 the patient was noted to have left-sided weakness with dysarthria and a facial droop. Emergency services was contacted and the patient was brought to the [facility] emergency department by EMS. On arrival she had a right gaze preference, left-sided weakness, left facial droop, dysarthria, and was complaining of a headache. A stat CT scan was performed which revealed a dense right MCA, concerning for acute thrombus in the distal M1/proximal M2 segments but no definite acute infarct. The patient is known to have a DO NOT RESUSCITATE order; however, the patient's daughter would like to have her treated aggressively and has agreed to [facility] therapy if the patient is an appropriate candidate. An INR is pending at the time of this dictation. Her NIH scale on admission was 13 on admission.    Date last known well: Date: 04/04/2014 Time last known well: Time: 15:10 tPA Given: Yes  Past Medical History  Diagnosis Date  . Dementia   . Hypothyroid   . GERD (gastroesophageal reflux disease)   . Atrial fibrillation   . Osteoporosis   . Depression   . Bladder cancer   . Breast cancer   . Hypertensive heart disease   . Presence of permanent cardiac pacemaker 09/20/2003    Medtronic EnPulse  . LBBB (left bundle branch block)     Past Surgical History  Procedure Laterality Date  . Permanent pacemaker insertion  09/20/2003    Medtronic EnPulse  . Cardiac catheterization  09/20/2003    patent  coronary arteries  . US echocardiography  01/15/2012    EF 45-50%,AL severley dilated,mod. mitral annular ca+,mild MR,mod TR,AOV mod. sclerotic  . Permanent pacemaker generator change  02/10/2013    Medtronic    Family History  Problem Relation Age of Onset  . Stroke Father    Social History:  reports that she has never smoked. She does not have any smokeless tobacco history on file. She reports that she does not drink alcohol. Her drug history is not on file.  Allergies:  Allergies  Allergen Reactions  . Actonel [Risedronate Sodium]   . Codeine Nausea And Vomiting  . Sulfa Antibiotics     unknown  . Toprol Xl [Metoprolol Tartrate]     Dizziness     Medications:  Current Facility-Administered Medications  Medication Dose Route Frequency Provider Last Rate Last Dose  . alteplase (ACTIVASE) 1 mg/mL infusion 55 mg  0.9 mg/kg (Order-Specific) Intravenous Once Delice Bison Ward, DO       Current Outpatient Prescriptions  Medication Sig Dispense Refill  . acetaminophen (TYLENOL) 325 MG tablet Take 650 mg by mouth every 6 (six) hours as needed.      . Calcium-Vitamin D (CALTRATE 600 PLUS-VIT D PO) Take by mouth daily.      . fluticasone (FLONASE) 50 MCG/ACT nasal spray Place 2 sprays into both nostrils daily.      . furosemide (LASIX) 40 MG tablet Take 40 mg by mouth daily. Takes an additional tablet if weight is over 145 lbs.      Marland Kitchen  ibandronate (BONIVA) 150 MG tablet Take 150 mg by mouth every 30 (thirty) days. Take in the morning with a full glass of water, on an empty stomach, and do not take anything else by mouth or lie down for the next 30 min.      . levalbuterol (XOPENEX HFA) 45 MCG/ACT inhaler Inhale 1-2 puffs into the lungs every 4 (four) hours as needed for wheezing.      Marland Kitchen levothyroxine (SYNTHROID, LEVOTHROID) 125 MCG tablet Take 125 mcg by mouth daily.        Marland Kitchen LORazepam (ATIVAN) 0.5 MG tablet Take 0.25 mg by mouth every 6 (six) hours as needed for anxiety.      . Multiple  Vitamins-Minerals (MULTIVITAMINS THER. W/MINERALS) TABS Take 1 tablet by mouth daily.        . pantoprazole (PROTONIX) 40 MG tablet Take 40 mg by mouth daily.       . polyethylene glycol (MIRALAX / GLYCOLAX) packet Take 4.25 g by mouth every other day.       . potassium chloride SA (K-DUR,KLOR-CON) 20 MEQ tablet Take 20 mEq by mouth daily.      . promethazine (PHENERGAN) 25 MG tablet Take 25 mg by mouth every 6 (six) hours as needed for nausea.      . sertraline (ZOLOFT) 50 MG tablet Take 50 mg by mouth daily.        . Verapamil HCl (CALAN SR PO) Take 300 mg by mouth daily.        Marland Kitchen warfarin (COUMADIN) 2.5 MG tablet Take 2.5-3.75 mg by mouth daily. 3.75 mg on Monday, Wednesday and fridays . 2.5 mg all other days.         ROS: Unobtainable at this time except for as noted above.  Physical Examination: Blood pressure 140/87, pulse 84, resp. rate 20, height 5\' 1"  (1.549 m), weight 134 lb 7 oz (60.98 kg), SpO2 95.00%. Gen: in bed, NAD CV: irregular Resp: CTAB Abd: NT, ND Ext: no edema.  Neurologic Examination:  Blood pressure 162/92, pulse 90, temperature 98.6 F (37 C), temperature source Oral, resp. rate 18, SpO2 98.00%.  Mental Status:  Alert, oriented, thought content appropriate. Speech severely dysarthric without evidence of aphasia. She has a prominent left hemineglect Cranial Nerves:  II: Discs flat bilaterally; Left hemianopia pupils equal, round, reactive to light and accommodation  III,IV, VI:  eyes towards the right, will not cross to left.  V,VII: left droop, decreased sensation on the left VIII: hearing normal bilaterally  IX,X: palate elevates symmetrically  XI: bilateral shoulder shrug  XII: tongue to the left Motor:  Right : Upper extremity 5/5 Left: She has good grip strength, but is unable to keep her arm rasied, markedly dyscoordinated movements R Lower extremity 5/5 L Lower extremity 4/5  Tone and bulk:normal tone throughout; no atrophy noted  Sensory: markedly  decreased throughout the left side.  Deep Tendon Reflexes: 2+ and symmetric throughout UE, bilateral KJ and 1+ AJ  Cerebellar:  unabel to perform on left, intact on right.  Gait:  Not assessed due to acute nature of evaluation and multiple medical monitors in ED setting.      Laboratory Studies:  Basic Metabolic Panel:  Recent Labs Lab 04/04/14 1608  NA 138  K 2.6*  CL 99  GLUCOSE 155*  BUN 21  CREATININE 1.10    Liver Function Tests: No results found for this basename: AST, ALT, ALKPHOS, BILITOT, PROT, ALBUMIN,  in the last 168 hours No results found  for this basename: LIPASE, AMYLASE,  in the last 168 hours No results found for this basename: AMMONIA,  in the last 168 hours  CBC:  Recent Labs Lab 04/04/14 1602 04/04/14 1608  WBC 6.0  --   NEUTROABS 3.6  --   HGB 11.2* 11.9*  HCT 33.5* 35.0*  MCV 101.5*  --   PLT 314  --     Cardiac Enzymes: No results found for this basename: CKTOTAL, CKMB, CKMBINDEX, TROPONINI,  in the last 168 hours  BNP: No components found with this basename: POCBNP,   CBG: No results found for this basename: GLUCAP,  in the last 168 hours  Microbiology: Results for orders placed during the hospital encounter of 02/10/13  SURGICAL PCR SCREEN     Status: None   Collection Time    02/10/13 10:55 AM      Result Value Ref Range Status   MRSA, PCR NEGATIVE  NEGATIVE Final   Staphylococcus aureus NEGATIVE  NEGATIVE Final   Comment:            The Xpert SA Assay (FDA     approved for NASAL specimens     in patients over 15 years of age),     is one component of     a comprehensive surveillance     program.  Test performance has     been validated by [employer] for patients greater     than or equal to 3 year old.     It is not intended     to diagnose infection nor to     guide or monitor treatment.    Coagulation Studies:  Recent Labs  04/04/14 1602  LABPROT 15.8*  INR 1.26    Urinalysis: No results found for  this basename: COLORURINE, APPERANCEUR, LABSPEC, PHURINE, GLUCOSEU, HGBUR, BILIRUBINUR, KETONESUR, PROTEINUR, UROBILINOGEN, NITRITE, LEUKOCYTESUR,  in the last 168 hours  Lipid Panel: No results found for this basename: chol, trig, hdl, cholhdl, vldl, ldlcalc    HgbA1C:  No results found for this basename: HGBA1C    Urine Drug Screen:   No results found for this basename: labopia, cocainscrnur, labbenz, amphetmu, thcu, labbarb    Alcohol Level: No results found for this basename: ETH,  in the last 168 hours  Other results: EKG -: Atrially paced rhythm rate 92 beats per minute.   Imaging:  Ct Head Wo Contrast 04/04/2014    1. Dense right MCA, concerning for acute thrombus in the distal M1/proximal M2 segments. No definite acute infarct is identified by CT.  2. No acute intracranial hemorrhage.      Mikey Bussing PA-C [facility] Pager [phone] 04/04/2014, 4:42 PM  I have seen and evaluated the patient. I have reviewed the above note and made appropriate changes.    Assessment: 80 y.o. female with a history of dementia, hypertension, atrial fibrillation, Coumadin therapy, permanent pacemaker, hypokalemia, and a recent TIA currently being admitted as a code stroke. She was within the window and therefore has received tPA  Stroke Risk Factors - atrial fibrillation, family history and hypertension  Plan: 1. HgbA1c, fasting lipid panel 2. MRI, MRA  of the brain without contrast 3. PT consult, OT consult, Speech consult 4. Echocardiogram 5. Carotid dopplers 6. Prophylactic therapy-None 7. Risk factor modification 8. Telemetry monitoring 9. Frequent neuro checks 10. Replete potassium with recheck following run.  11. Check B12, folate given macrocytic anemia  This patient is critically ill and at  significant risk of neurological worsening, death and care requires constant monitoring of vital signs, hemodynamics,respiratory and cardiac monitoring, neurological  assessment, discussion with family, other specialists and medical decision making of high complexity. I spent 60 minutes of neurocritical care time  in the care of  this patient.  Roland Rack, MD [facility] [phone]  If 7pm- 7am, please page neurology on call as listed in [place]. 04/04/2014  5:36 PM

## 2014-04-04 NOTE — Progress Notes (Signed)
Pt noted to have unequal pupils.  Right pupil is a size 4, round, and briskly reactive to light.  Left pupil is a size 3, round, and briskly reactive to light.  Pt is A&O to person and intermittently to place.  Pt follows commands well.  No other neurological changes noted.  Vital signs remain stable.  MD (Neuro-Stewart) notified.  No new orders given at this time.  Will continue to monitor.

## 2014-04-04 NOTE — Progress Notes (Signed)
Pt has c/o headache, substernal chest pain, and nausea.  Pt does not appear in distress.  Vital signs remain stable.  MD (Neuro-Stewart) notified.  Orders given.  Will continue to monitor.

## 2014-04-04 NOTE — ED Notes (Signed)
Received pt from [facility] with c/o code stroke. Pt deficits are slurred speech and facial droop.

## 2014-04-04 NOTE — ED Notes (Signed)
Abnormal labs given to Dr. Leonides Schanz

## 2014-04-04 NOTE — ED Provider Notes (Signed)
TIME SEEN: 4:21 PM  CHIEF COMPLAINT: Code stroke  HPI: Patient is a 80 year old female with history of dementia who lives at [facility] mammary to your, hypothyroidism, atrial fibrillation on Coumadin, prior history of breast and bladder cancer, left bundle branch block, pacemaker who presents to the emergency department as a code stroke. Patient was last seen normal at 3 PM. She was then found by nursing home staff at 3:10 PM with dysarthria, left-sided weakness. Glucose was EMS was 227. Blood pressure was 140s/80s.  ROS: Level V caveat for dementia  PAST MEDICAL HISTORY/PAST SURGICAL HISTORY:  Past Medical History  Diagnosis Date  . Dementia   . Hypothyroid   . GERD (gastroesophageal reflux disease)   . Atrial fibrillation   . Osteoporosis   . Depression   . Bladder cancer   . Breast cancer   . Hypertensive heart disease   . Presence of permanent cardiac pacemaker 09/20/2003    Medtronic EnPulse  . LBBB (left bundle branch block)     MEDICATIONS:  Prior to Admission medications   Medication Sig Start Date End Date Taking? Authorizing Provider  acetaminophen (TYLENOL) 325 MG tablet Take 650 mg by mouth every 6 (six) hours as needed.    Historical Provider, MD  Calcium-Vitamin D (CALTRATE 600 PLUS-VIT D PO) Take by mouth daily.    Historical Provider, MD  fluticasone (FLONASE) 50 MCG/ACT nasal spray Place 2 sprays into both nostrils daily.    Historical Provider, MD  furosemide (LASIX) 40 MG tablet Take 40 mg by mouth daily. Takes an additional tablet if weight is over 145 lbs.    Historical Provider, MD  ibandronate (BONIVA) 150 MG tablet Take 150 mg by mouth every 30 (thirty) days. Take in the morning with a full glass of water, on an empty stomach, and do not take anything else by mouth or lie down for the next 30 min.    Historical Provider, MD  levalbuterol [facility] HFA) 45 MCG/ACT inhaler Inhale 1-2 puffs into the lungs every 4 (four) hours as needed for wheezing.     Historical Provider, MD  levothyroxine (SYNTHROID, LEVOTHROID) 125 MCG tablet Take 125 mcg by mouth daily.      Historical Provider, MD  LORazepam (ATIVAN) 0.5 MG tablet Take 0.25 mg by mouth every 6 (six) hours as needed for anxiety.    Historical Provider, MD  metoprolol (LOPRESSOR) 50 MG tablet TAKE 1 AND 1/2 TABLETS (75MG ) BY MOUTH TWICE DAILY. 07/15/13   Mihai Croitoru, MD  Multiple Vitamins-Minerals (MULTIVITAMINS THER. W/MINERALS) TABS Take 1 tablet by mouth daily.      Historical Provider, MD  pantoprazole (PROTONIX) 40 MG tablet Take 40 mg by mouth daily.     Historical Provider, MD  polyethylene glycol (MIRALAX / GLYCOLAX) packet Take 4.25 g by mouth every other day.     Historical Provider, MD  potassium chloride SA (K-DUR,KLOR-CON) 20 MEQ tablet Take 20 mEq by mouth daily.    Historical Provider, MD  promethazine (PHENERGAN) 25 MG tablet Take 25 mg by mouth every 6 (six) hours as needed for nausea.    Historical Provider, MD  sertraline (ZOLOFT) 50 MG tablet Take 50 mg by mouth daily.      Historical Provider, MD  Verapamil HCl (CALAN SR PO) Take 300 mg by mouth daily.      Historical Provider, MD  warfarin (COUMADIN) 2.5 MG tablet Take 2.5-3.75 mg by mouth daily. 3.75 mg on Monday, Wednesday and fridays . 2.5 mg all  other days.    Historical Provider, MD    ALLERGIES:  Allergies  Allergen Reactions  . Actonel [Risedronate Sodium]   . Codeine Nausea And Vomiting  . Sulfa Antibiotics     unknown  . Toprol Xl [Metoprolol Tartrate]     Dizziness     SOCIAL HISTORY:  History  Substance Use Topics  . Smoking status: Never Smoker   . Smokeless tobacco: Not on file  . Alcohol Use: No    FAMILY HISTORY: Family History  Problem Relation Age of Onset  . Stroke Father     EXAM: BP 140/87  Pulse 84  Resp 20  SpO2 95% CONSTITUTIONAL: Alert and oriented to person, elderly, in no distress HEAD: Normocephalic EYES: Conjunctivae clear, PERRL ENT: normal nose; no  rhinorrhea; moist mucous membranes; pharynx without lesions noted NECK: Supple, no meningismus, no LAD  CARD: RRR; S1 and S2 appreciated; no murmurs, no clicks, no rubs, no gallops RESP: Normal chest excursion without splinting or tachypnea; breath sounds clear and equal bilaterally; no wheezes, no rhonchi, no rales,  ABD/GI: Normal bowel sounds; non-distended; soft, non-tender, no rebound, no guarding BACK:  The back appears normal and is non-tender to palpation, there is no CVA tenderness EXT: Normal ROM in all joints; non-tender to palpation; no edema; normal capillary refill; no cyanosis    SKIN: Normal color for age and race; warm NEURO: Patient is dysarthric, seems to have left-sided neglect, left upper and lower extremity weakness, left-sided facial droop, NIH stroke scale is 40 PSYCH: The patient's mood and manner are appropriate. Grooming and personal hygiene are appropriate.  MEDICAL DECISION MAKING: Patient here as a code stroke. Dr. Leonel Ramsay with neurology at bedside. She is hemodynamically stable.  CT scan shows a dense right MCA concerning for acute thrombus but no definite acute infarct. No hemorrhage. Patient is a TPA candidate. We are waiting for her INR to result. Neurology as discussed with family. Patient is a DO NOT RESUSCITATE.  ED PROGRESS: 4:44 PM  Neuro to give TPA and admit to ICU.  BP stable.      CRITICAL CARE Performed by: Nyra Jabs   Total critical care time: 35 minutes  Critical care time was exclusive of separately billable procedures and treating other patients.  Critical care was necessary to treat or prevent imminent or life-threatening deterioration.  Critical care was time spent personally by me on the following activities: development of treatment plan with patient and/or surrogate as well as nursing, discussions with consultants, evaluation of patient's response to treatment, examination of patient, obtaining history from patient or  surrogate, ordering and performing treatments and interventions, ordering and review of laboratory studies, ordering and review of radiographic studies, pulse oximetry and re-evaluation of patient's condition.       EKG Interpretation  Date/Time:  Sunday April 04 2014 16:21:09 EDT Ventricular Rate:  92 PR Interval:  154 QRS Duration: 155 QT Interval:  410 QTC Calculation: 507 R Axis:   42 Text Interpretation:  Atrial-paced complexes IVCD, consider atypical LBBB No significant change since last tracing Confirmed by WARD,  DO, KRISTEN [phone]) on 04/04/2014 4:28:30 PM        [place], DO 04/04/14 1645

## 2014-04-04 NOTE — Code Documentation (Signed)
Code stoke called at 1541, Patient arrived to [facility] ED via [facility] at 1559. As per EMS has right gaze facial droop slureed speech and left weakness.  I called and spoke to a med tech at [employer] and she stated LSN 1500 staff changed shift and patient was normal and at 1510 staff was notified that she had a facial droop and slurred speech.  NIHSS 13.  Pharmacy called for TPA at 1632, and TPA delivered at 1640.  TPA started at 1644.  Family at bedside.  ICU bed requested

## 2014-04-04 NOTE — Progress Notes (Signed)
Pharmacy Medication Note:  Pharmacy received call for alteplase @ 1632. Medication was delivered to D32 @ 1640.  Janina Mayo, PharmD Clinical Pharmacist [phone] 04/04/2014, 4:44 PM

## 2014-04-05 ENCOUNTER — Inpatient Hospital Stay (HOSPITAL_COMMUNITY): Payer: Medicare Other

## 2014-04-05 DIAGNOSIS — F0280 Dementia in other diseases classified elsewhere without behavioral disturbance: Secondary | ICD-10-CM

## 2014-04-05 DIAGNOSIS — I4891 Unspecified atrial fibrillation: Secondary | ICD-10-CM

## 2014-04-05 DIAGNOSIS — I369 Nonrheumatic tricuspid valve disorder, unspecified: Secondary | ICD-10-CM

## 2014-04-05 DIAGNOSIS — G309 Alzheimer's disease, unspecified: Secondary | ICD-10-CM

## 2014-04-05 LAB — FOLATE: Folate: >20 ng/mL

## 2014-04-05 LAB — BASIC METABOLIC PANEL
Anion gap: 12 (ref 5–15)
BUN: 16 mg/dL (ref 6–23)
CO2: 24 mEq/L (ref 19–32)
Calcium: 8.4 mg/dL (ref 8.4–10.5)
Chloride: 103 mEq/L (ref 96–112)
Creatinine, Ser: 0.86 mg/dL (ref 0.50–1.10)
GFR, EST AFRICAN AMERICAN: 65 mL/min — AB (ref >90)
GFR, EST NON AFRICAN AMERICAN: 56 mL/min — AB (ref >90)
Glucose, Bld: 106 mg/dL — ABNORMAL HIGH (ref 70–99)
Potassium: 3.9 mEq/L (ref 3.7–5.3)
SODIUM: 139 meq/L (ref 137–147)

## 2014-04-05 LAB — LIPID PANEL
CHOLESTEROL: 134 mg/dL (ref 0–200)
HDL: 64 mg/dL (ref >39)
LDL Cholesterol: 64 mg/dL (ref 0–99)
Total CHOL/HDL Ratio: 2.1 RATIO
Triglycerides: 32 mg/dL (ref <150)
VLDL: 6 mg/dL (ref 0–40)

## 2014-04-05 LAB — CBC
HCT: 32.8 % — ABNORMAL LOW (ref 36.0–46.0)
Hemoglobin: 10.9 g/dL — ABNORMAL LOW (ref 12.0–15.0)
MCH: 34.1 pg — AB (ref 26.0–34.0)
MCHC: 33.2 g/dL (ref 30.0–36.0)
MCV: 102.5 fL — ABNORMAL HIGH (ref 78.0–100.0)
PLATELETS: 301 10*3/uL (ref 150–400)
RBC: 3.2 MIL/uL — ABNORMAL LOW (ref 3.87–5.11)
RDW: 14.1 % (ref 11.5–15.5)
WBC: 9.9 10*3/uL (ref 4.0–10.5)

## 2014-04-05 LAB — VITAMIN B12: Vitamin B-12: 473 pg/mL (ref 211–911)

## 2014-04-05 LAB — HEMOGLOBIN A1C
HEMOGLOBIN A1C: 5.6 % (ref <5.7)
MEAN PLASMA GLUCOSE: 114 mg/dL (ref <117)

## 2014-04-05 MED ORDER — IOHEXOL 350 MG/ML SOLN
50.0000 mL | Freq: Once | INTRAVENOUS | Status: AC | PRN
  Administered 2014-04-05: 50 mL via INTRAVENOUS

## 2014-04-05 MED ORDER — ASPIRIN 300 MG RE SUPP
300.0000 mg | Freq: Every day | RECTAL | Status: DC
  Administered 2014-04-05 – 2014-04-06 (×2): 300 mg via RECTAL
  Filled 2014-04-05 (×3): qty 1

## 2014-04-05 NOTE — Progress Notes (Signed)
Utilization Review Completed.Heidi Harrell T9/01/2014  

## 2014-04-05 NOTE — H&P (Signed)
Referring Physician: Dr. Leonides Schanz    Chief Complaint: Code stroke  HPI: Heidi Harrell is a 80 y.o. female who resides in the memory care unit at the [facility] in [place] with a history of atrial fibrillation on Coumadin therapy, permanent pacemaker, dementia, and hypertension. Yesterday the patient had a TIA they consisted of a facial droop which lasted approximately 2 minutes according to the patient's daughter. When this resolved she was back to baseline. Today at approximately 1510 the patient was noted to have left-sided weakness with dysarthria and a facial droop. Emergency services was contacted and the patient was brought to the [facility] emergency department by EMS. On arrival she had a right gaze preference, left-sided weakness, left facial droop, dysarthria, and was complaining of a headache. A stat CT scan was performed which revealed a dense right MCA, concerning for acute thrombus in the distal M1/proximal M2 segments but no definite acute infarct. The patient is known to have a DO NOT RESUSCITATE order; however, the patient's daughter would like to have her treated aggressively and has agreed to [facility] therapy if the patient is an appropriate candidate. An INR is pending at the time of this dictation. Her NIH scale on admission was 13 on admission.    Date last known well: Date: 04/04/2014 Time last known well: Time: 15:10 tPA Given: Yes  Past Medical History  Diagnosis Date  . Dementia   . Hypothyroid   . GERD (gastroesophageal reflux disease)   . Atrial fibrillation   . Osteoporosis   . Depression   . Bladder cancer   . Breast cancer   . Hypertensive heart disease   . Presence of permanent cardiac pacemaker 09/20/2003    Medtronic EnPulse  . LBBB (left bundle branch block)     Past Surgical History  Procedure Laterality Date  . Permanent pacemaker insertion  09/20/2003    Medtronic EnPulse  . Cardiac catheterization  09/20/2003    patent  coronary arteries  . US echocardiography  01/15/2012    EF 45-50%,AL severley dilated,mod. mitral annular ca+,mild MR,mod TR,AOV mod. sclerotic  . Permanent pacemaker generator change  02/10/2013    Medtronic    Family History  Problem Relation Age of Onset  . Stroke Father    Social History:  reports that she has never smoked. She does not have any smokeless tobacco history on file. She reports that she does not drink alcohol or use illicit drugs.  Allergies:  Allergies  Allergen Reactions  . Toprol Xl [Metoprolol Tartrate] Other (See Comments)    Dizziness   . Actonel [Risedronate Sodium] Other (See Comments)    Unknown  . Codeine Nausea And Vomiting  . Fosamax [Alendronate Sodium] Other (See Comments)    Per MAR  . Sulfa Antibiotics Other (See Comments)    unknown    Medications:  Current Facility-Administered Medications  Medication Dose Route Frequency Provider Last Rate Last Dose  .  stroke: mapping our early stages of recovery book   Does not apply Once Roland Rack, MD      . 0.9 %  sodium chloride infusion   Intravenous Continuous Roland Rack, MD 75 mL/hr at 04/05/14 0700    . acetaminophen (TYLENOL) tablet 650 mg  650 mg Oral Q4H PRN Roland Rack, MD       Or  . acetaminophen (TYLENOL) suppository 650 mg  650 mg Rectal Q4H PRN Roland Rack, MD      . antiseptic oral rinse ([place] /  CETYLPYRIDINIUM CHLORIDE 0.05%) solution 7 mL  7 mL Mouth Rinse BID Wallie Char      . HYDROmorphone (DILAUDID) injection 1-2 mg  1-2 mg Intravenous Q3H PRN Wallie Char   1 mg at 04/04/14 2334  . labetalol (NORMODYNE,TRANDATE) injection 10 mg  10 mg Intravenous Q10 min PRN Roland Rack, MD      . ondansetron [facility]) injection 4 mg  4 mg Intravenous Q6H PRN Wallie Char   4 mg at 04/04/14 2334  . pantoprazole (PROTONIX) injection 40 mg  40 mg Intravenous QHS Roland Rack, MD   40 mg at 04/04/14 2144     ROS: Unobtainable at this time  except for as noted above.  Physical Examination: Blood pressure 138/59, pulse 101, temperature 97.7 F (36.5 C), temperature source Axillary, resp. rate 11, height 5\' 1"  (1.549 m), weight 70.3 kg (154 lb 15.7 oz), SpO2 97.00%. Gen: in bed, NAD CV: irregular Resp: CTAB Abd: NT, ND Ext: no edema.  Neurologic Examination:  Blood pressure 162/92, pulse 90, temperature 98.6 F (37 C), temperature source Oral, resp. rate 18, SpO2 98.00%.  Mental Status:  Alert, oriented, thought content appropriate. Speech severely dysarthric without evidence of aphasia. She has a prominent left hemineglect Cranial Nerves:  II: Discs flat bilaterally; Left hemianopia pupils equal, round, reactive to light and accommodation  III,IV, VI:  eyes towards the right, will not cross to left.  V,VII: left droop, decreased sensation on the left VIII: hearing normal bilaterally  IX,X: palate elevates symmetrically  XI: bilateral shoulder shrug  XII: tongue to the left Motor:  Right : Upper extremity 5/5 Left: She has good grip strength, but is unable to keep her arm rasied, markedly dyscoordinated movements R Lower extremity 5/5 L Lower extremity 4/5  Tone and bulk:normal tone throughout; no atrophy noted  Sensory: markedly decreased throughout the left side.  Deep Tendon Reflexes: 2+ and symmetric throughout UE, bilateral KJ and 1+ AJ  Cerebellar:  unabel to perform on left, intact on right.  Gait:  Not assessed due to acute nature of evaluation and multiple medical monitors in ED setting.      Laboratory Studies:  Basic Metabolic Panel:  Recent Labs Lab 04/04/14 1602 04/04/14 1608 04/04/14 1830 04/04/14 2225 04/05/14 0258  NA 139 138  --   --  139  K 2.9* 2.6* 3.4* 3.5* 3.9  CL 98 99  --   --  103  CO2 26  --   --   --  24  GLUCOSE 150* 155*  --   --  106*  BUN 22 21  --   --  16  CREATININE 1.06 1.10  --   --  0.86  CALCIUM 8.9  --   --   --  8.4    Liver Function Tests:  Recent  Labs Lab 04/04/14 1602  AST 27  ALT 16  ALKPHOS 103  BILITOT 0.3  PROT 7.3  ALBUMIN 3.5   No results found for this basename: LIPASE, AMYLASE,  in the last 168 hours No results found for this basename: AMMONIA,  in the last 168 hours  CBC:  Recent Labs Lab 04/04/14 1602 04/04/14 1608 04/05/14 0258  WBC 6.0  --  9.9  NEUTROABS 3.6  --   --   HGB 11.2* 11.9* 10.9*  HCT 33.5* 35.0* 32.8*  MCV 101.5*  --  102.5*  PLT 314  --  301    Cardiac Enzymes: No results found for this basename: CKTOTAL, CKMB,  CKMBINDEX, TROPONINI,  in the last 168 hours  BNP: No components found with this basename: POCBNP,   CBG: No results found for this basename: GLUCAP,  in the last 168 hours  Microbiology: Results for orders placed during the hospital encounter of 04/04/14  MRSA PCR SCREENING     Status: None   Collection Time    04/04/14  6:38 PM      Result Value Ref Range Status   MRSA by PCR NEGATIVE  NEGATIVE Final   Comment:            The GeneXpert MRSA Assay (FDA     approved for NASAL specimens     only), is one component of a     comprehensive MRSA colonization     surveillance program. It is not     intended to diagnose MRSA     infection nor to guide or     monitor treatment for     MRSA infections.    Coagulation Studies:  Recent Labs  04/04/14 1602  LABPROT 15.8*  INR 1.26    Urinalysis:   Recent Labs Lab 04/04/14 2133  COLORURINE YELLOW  LABSPEC 1.011  PHURINE 7.0  GLUCOSEU NEGATIVE  HGBUR MODERATE*  BILIRUBINUR NEGATIVE  KETONESUR NEGATIVE  PROTEINUR NEGATIVE  UROBILINOGEN 0.2  NITRITE NEGATIVE  LEUKOCYTESUR NEGATIVE    Lipid Panel:    Component Value Date/Time   CHOL 134 04/05/2014 0258    HgbA1C:  No results found for this basename: HGBA1C    Urine Drug Screen:      Component Value Date/Time   LABOPIA NONE DETECTED 04/04/2014 2133    Alcohol Level:   Recent Labs Lab 04/04/14 [place] <11    Other results: EKG -: Atrially  paced rhythm rate 92 beats per minute.   Imaging:  Ct Head Wo Contrast 04/04/2014    1. Dense right MCA, concerning for acute thrombus in the distal M1/proximal M2 segments. No definite acute infarct is identified by CT.  2. No acute intracranial hemorrhage.      Mikey Bussing PA-C [facility] Pager [phone] 04/04/2014, 4:42 PM  I have seen and evaluated the patient. I have reviewed the above note and made appropriate changes.    Assessment: 80 y.o. female with a history of dementia, hypertension, atrial fibrillation, Coumadin therapy, permanent pacemaker, hypokalemia, and a recent TIA currently being admitted as a code stroke. She was within the window and therefore has received tPA  Stroke Risk Factors - atrial fibrillation, family history and hypertension  Plan: 1. HgbA1c, fasting lipid panel 2. MRI, MRA  of the brain without contrast 3. PT consult, OT consult, Speech consult 4. Echocardiogram 5. Carotid dopplers 6. Prophylactic therapy-None 7. Risk factor modification 8. Telemetry monitoring 9. Frequent neuro checks 10. Replete potassium with recheck following run.  11. Check B12, folate given macrocytic anemia  This patient is critically ill and at significant risk of neurological worsening, death and care requires constant monitoring of vital signs, hemodynamics,respiratory and cardiac monitoring, neurological assessment, discussion with family, other specialists and medical decision making of high complexity. I spent 60 minutes of neurocritical care time  in the care of  this patient.  Roland Rack, MD [facility] [phone]  If 7pm- 7am, please page neurology on call as listed in [place]. 04/05/2014  7:31 AM

## 2014-04-05 NOTE — Evaluation (Signed)
Clinical/Bedside Swallow Evaluation Patient Details  Name: Heidi Harrell MRN: [id number] Date of Birth: [birth year]  Today's Date: 04/05/2014 Time: 0920-0933 SLP Time Calculation (min): 13 min  Past Medical History:  Past Medical History  Diagnosis Date  . Dementia   . Hypothyroid   . GERD (gastroesophageal reflux disease)   . Atrial fibrillation   . Osteoporosis   . Depression   . Bladder cancer   . Breast cancer   . Hypertensive heart disease   . Presence of permanent cardiac pacemaker 09/20/2003    Medtronic EnPulse  . LBBB (left bundle branch block)    Past Surgical History:  Past Surgical History  Procedure Laterality Date  . Permanent pacemaker insertion  09/20/2003    Medtronic EnPulse  . Cardiac catheterization  09/20/2003    patent coronary arteries  . US echocardiography  01/15/2012    EF 45-50%,AL severley dilated,mod. mitral annular ca+,mild MR,mod TR,AOV mod. sclerotic  . Permanent pacemaker generator change  02/10/2013    Medtronic   HPI:  80 y.o. female who resides in the memory care unit at the [facility] in [place] with a history of atrial fibrillation on Coumadin therapy, permanent pacemaker, dementia, GERD. and hypertension. Admitted 9/6 with left sided weakness, facial droop, dysarthria and c/o headache. CT can revealed a dense rigth MCA CVA.  S/p tPA.   Assessment / Plan / Recommendation Clinical Impression  Bedside swallow evaluation complete. Patient with significant lethargy impacting ability to safely consume pos at this time. Arouses easily with min verbal cueing from SLP however following oral acceptance of bolus, patient required max cueing for sustain adequate level of alertness for timely oral transit of bolus. Severity of left sided oral phase deficits further increase risk of dysphagia and aspiration. Recommend NPO. Prognosis for ability to consume po good with improved alertness. May require instrumental testing. Will f/u at  bedside for diagnostic po trials.     Aspiration Risk  Moderate    Diet Recommendation NPO   Medication Administration: Via alternative means    Other  Recommendations Oral Care Recommendations:  (QID)   Follow Up Recommendations  Skilled Nursing facility    Frequency and Duration min 3x week  2 weeks   Pertinent Vitals/Pain n/a        Swallow Study    General HPI: 80 y.o. female who resides in the memory care unit at the [facility] in [place] with a history of atrial fibrillation on Coumadin therapy, permanent pacemaker, dementia, GERD. and hypertension. Admitted 9/6 with left sided weakness, facial droop, dysarthria and c/o headache. CT can revealed a dense rigth MCA CVA.  S/p tPA. Type of Study: Bedside swallow evaluation Previous Swallow Assessment: none noted Diet Prior to this Study: NPO Temperature Spikes Noted: No Respiratory Status: Room air History of Recent Intubation: No Behavior/Cognition: Lethargic;Cooperative;Pleasant mood;Confused;Decreased sustained attention Oral Cavity - Dentition: Adequate natural dentition Self-Feeding Abilities: Needs assist Patient Positioning: Upright in bed Baseline Vocal Quality: Clear Volitional Cough: Weak Volitional Swallow: Able to elicit    Oral/Motor/Sensory Function Overall Oral Motor/Sensory Function: Impaired Labial ROM: Reduced left Labial Symmetry: Abnormal symmetry left Labial Strength: Reduced Labial Sensation: Reduced Lingual ROM: Reduced right;Reduced left Lingual Symmetry: Within Functional Limits Lingual Strength: Reduced Facial ROM: Reduced left Facial Symmetry: Left droop Facial Strength: Reduced Facial Sensation: Reduced Velum: Within Functional Limits Mandible: Within Functional Limits   Ice Chips Ice chips: Impaired Presentation: Spoon Oral Phase Impairments: Impaired anterior to posterior transit Oral Phase Functional  Implications: Prolonged oral transit;Oral holding   Thin  Liquid Thin Liquid: Not tested    Nectar Thick Nectar Thick Liquid: Not tested   Honey Thick Honey Thick Liquid: Not tested   Puree Puree: Impaired Presentation: Spoon Oral Phase Impairments: Impaired anterior to posterior transit;Reduced lingual movement/coordination Oral Phase Functional Implications: Prolonged oral transit;Oral residue;Oral holding Pharyngeal Phase Impairments: Suspected delayed Swallow;Multiple swallows   Solid   GO   Dawnmarie Breon MA, CCC-SLP [phone]  Solid: Not tested       Chamille Werntz Meryl 04/05/2014,9:53 AM

## 2014-04-05 NOTE — Progress Notes (Signed)
PT Cancellation Note  Patient Details Name: Heidi Harrell MRN: [id number] DOB: [birth year]   Cancelled Treatment:    Reason Eval/Treat Not Completed: Medical issues which prohibited therapy (pt currently on strict bedrest)   Lanetta Inch Beth 04/05/2014, 7:20 AM Elwyn Reach, [place]

## 2014-04-05 NOTE — Progress Notes (Signed)
OT Cancellation Note  Patient Details Name: Heidi Harrell MRN: [id number] DOB: 1919/05/06   Cancelled Treatment:    Reason Eval/Treat Not Completed: Patient not medically ready (Remains on strict bedrest.  Will continue to follow.)  [name] 04/05/2014, 11:56 AM

## 2014-04-05 NOTE — Evaluation (Addendum)
Speech Language Pathology Evaluation Patient Details Name: Heidi Harrell MRN: [id number] DOB: [birth year] Today's Date: 04/05/2014 Time: 8250-5397 SLP Time Calculation (min): 12 min  Problem List:  Patient Active Problem List   Diagnosis Date Noted  . Stroke 04/04/2014  . Chronic combined systolic and diastolic heart failure, NYHA class 2 02/23/2014  . Left bundle branch block 01/27/2013  . Pacemaker - single chamber Medtronic 01/27/2013  . Alzheimer disease 01/27/2013  . Atrial fibrillation 10/17/2012  . Long term (current) use of anticoagulants 10/17/2012   Past Medical History:  Past Medical History  Diagnosis Date  . Dementia   . Hypothyroid   . GERD (gastroesophageal reflux disease)   . Atrial fibrillation   . Osteoporosis   . Depression   . Bladder cancer   . Breast cancer   . Hypertensive heart disease   . Presence of permanent cardiac pacemaker 09/20/2003    Medtronic EnPulse  . LBBB (left bundle branch block)    Past Surgical History:  Past Surgical History  Procedure Laterality Date  . Permanent pacemaker insertion  09/20/2003    Medtronic EnPulse  . Cardiac catheterization  09/20/2003    patent coronary arteries  . US echocardiography  01/15/2012    EF 45-50%,AL severley dilated,mod. mitral annular ca+,mild MR,mod TR,AOV mod. sclerotic  . Permanent pacemaker generator change  02/10/2013    Medtronic   HPI:  80 y.o. female who resides in the memory care unit at the [facility] in [place] with a history of atrial fibrillation on Coumadin therapy, permanent pacemaker, dementia, GERD. and hypertension. Admitted 9/6 with left sided weakness, facial droop, dysarthria and c/o headache. CT can revealed a dense rigth MCA CVA.  S/p tPA.   Assessment / Plan / Recommendation Clinical Impression  Cognitive-linguistic evaluation complete. Patient presents with cognitive impairements impacting speech intelligibility, sustained attention, intellectual  awareness, memory, and problem solving abilities in addtion to notable left sided inattention vs visual field cut. Patient with known dementia impacting overall cognitive function at baseline. Will benefit from SLP f/u in the acute care setting to facilitate cognitive-linguistic abilities during basic functional ADLs.    SLP Assessment  Patient needs continued Speech Lanaguage Pathology Services    Follow Up Recommendations  Skilled Nursing facility    Frequency and Duration min 3x week  2 weeks   Pertinent Vitals/Pain Pain Assessment: No/denies pain   SLP Goals  SLP Goals Potential to Achieve Goals: Fair Potential Considerations: Co-morbidities Progress/Goals/Alternative treatment plan discussed with pt/caregiver and they: Agree  SLP Evaluation Prior Functioning  Cognitive/Linguistic Baseline: Baseline deficits (baseline dementia)   Cognition  Overall Cognitive Status: Impaired/Different from baseline Arousal/Alertness: Lethargic Orientation Level: Oriented to person;Oriented to place;Disoriented to time;Disoriented to situation (disoriented to place for this SLP initially) Attention: Focused Focused Attention: Appears intact Sustained Attention: Impaired Sustained Attention Impairment: Verbal basic;Functional basic Memory: Impaired Memory Impairment: Storage deficit;Decreased recall of new information;Decreased short term memory;Decreased long term memory Decreased Long Term Memory: Verbal basic;Functional basic Decreased Short Term Memory: Verbal basic;Functional basic Awareness: Impaired Awareness Impairment: Intellectual impairment Problem Solving: Impaired Problem Solving Impairment: Verbal complex;Functional complex Comments: patient lethargic at time of evaluation limiting depth of evaluation    Comprehension  Auditory Comprehension Overall Auditory Comprehension: Appears within functional limits for tasks assessed (for basic directions in the context of functional  activities) Visual Recognition/Discrimination Discrimination: Not tested Reading Comprehension Reading Status: Not tested    Expression Expression Primary Mode of Expression: Verbal Verbal Expression Overall Verbal Expression: Appears within  functional limits for tasks assessed   Oral / Motor Oral Motor/Sensory Function Overall Oral Motor/Sensory Function: Impaired Labial ROM: Reduced left Labial Symmetry: Abnormal symmetry left Labial Strength: Reduced Labial Sensation: Reduced Lingual ROM: Reduced right;Reduced left Lingual Symmetry: Within Functional Limits Lingual Strength: Reduced Facial ROM: Reduced left Facial Symmetry: Left droop Facial Strength: Reduced Facial Sensation: Reduced Velum: Within Functional Limits Mandible: Within Functional Limits Motor Speech Overall Motor Speech: Impaired Respiration: Within functional limits Phonation: Normal Resonance: Within functional limits Articulation: Impaired Level of Impairment: Sentence Intelligibility: Intelligibility reduced Word: 75-100% accurate Phrase: 75-100% accurate Sentence: 75-100% accurate Conversation: 75-100% accurate Motor Planning: Witnin functional limits   GO   Parker Hannifin MA, CCC-SLP [phone]   Vanissa Strength Meryl 04/05/2014, 10:04 AM

## 2014-04-05 NOTE — Progress Notes (Signed)
STROKE TEAM PROGRESS NOTE   HISTORY Heidi Harrell is a 80 y.o. female who resides in the memory care unit at the [facility] in [place] with a history of atrial fibrillation on Coumadin therapy, permanent pacemaker, dementia, and hypertension. Yesterday 04/03/2014 the patient had a TIA that consisted of a facial droop which lasted approximately 2 minutes according to the patient's daughter. When this resolved she was back to baseline. Today 04/04/2014 at approximately 1510 the patient was noted to have left-sided weakness with dysarthria and a facial droop. Emergency services was contacted and the patient was brought to the [facility] emergency department by EMS. On arrival she had a right gaze preference, left-sided weakness, left facial droop, dysarthria, and was complaining of a headache. A stat CT scan was performed which revealed a dense right MCA, concerning for acute thrombus in the distal M1/proximal M2 segments but no definite acute infarct. The patient is known to have a DO NOT RESUSCITATE order; however, the patient's daughter would like to have her treated aggressively and has agreed to [facility] therapy if the patient is an appropriate candidate. An INR is pending at the time of this dictation. Her NIH scale on admission was 13 on admission. Patient was administered TPA. She was admitted to the neuro ICU for further evaluation and treatment.   SUBJECTIVE (INTERVAL HISTORY) Her nurses are at the bedside.  Overall she feels her condition is stable. She is awake and interactive but has dense left arm weakness though she is able to move the left leg .Blood pressure has been adequately controlled..   OBJECTIVE Temp:  [97.4 F (36.3 C)-98 F (36.7 C)] 97.5 F (36.4 C) (09/07 0746) Pulse Rate:  [80-129] 118 (09/07 0900) Cardiac Rhythm:  [-] Atrial fibrillation (09/07 0800) Resp:  [10-26] 16 (09/07 0900) BP: (83-152)/(52-116) 143/55 mmHg (09/07 0900) SpO2:  [94 %-100 %] 96  % (09/07 0900) Weight:  [60.98 kg (134 lb 7 oz)-70.3 kg (154 lb 15.7 oz)] 70.3 kg (154 lb 15.7 oz) (09/07 0400)    Recent Labs Lab 04/04/14 1602 04/04/14 1608 04/04/14 1830 04/04/14 2225 04/05/14 0258  NA 139 138  --   --  139  K 2.9* 2.6* 3.4* 3.5* 3.9  CL 98 99  --   --  103  CO2 26  --   --   --  24  GLUCOSE 150* 155*  --   --  106*  BUN 22 21  --   --  16  CREATININE 1.06 1.10  --   --  0.86  CALCIUM 8.9  --   --   --  8.4    Recent Labs Lab 04/04/14 1602  AST 27  ALT 16  ALKPHOS 103  BILITOT 0.3  PROT 7.3  ALBUMIN 3.5    Recent Labs Lab 04/04/14 1602 04/04/14 1608 04/05/14 0258  WBC 6.0  --  9.9  NEUTROABS 3.6  --   --   HGB 11.2* 11.9* 10.9*  HCT 33.5* 35.0* 32.8*  MCV 101.5*  --  102.5*  PLT 314  --  301   No results found for this basename: CKTOTAL, CKMB, CKMBINDEX, TROPONINI,  in the last 168 hours  Recent Labs  04/04/14 1602  LABPROT 15.8*  INR 1.26    Recent Labs  04/04/14 2133  COLORURINE YELLOW  LABSPEC 1.011  PHURINE 7.0  GLUCOSEU NEGATIVE  HGBUR MODERATE*  BILIRUBINUR NEGATIVE  KETONESUR NEGATIVE  PROTEINUR NEGATIVE  UROBILINOGEN 0.2  NITRITE NEGATIVE  LEUKOCYTESUR NEGATIVE       Component Value Date/Time   CHOL 134 04/05/2014 0258   TRIG 32 04/05/2014 0258   HDL 64 04/05/2014 0258   CHOLHDL 2.1 04/05/2014 0258   VLDL 6 04/05/2014 0258   LDLCALC 64 04/05/2014 0258   No results found for this basename: HGBA1C      Component Value Date/Time   LABOPIA NONE DETECTED 04/04/2014 2133   COCAINSCRNUR NONE DETECTED 04/04/2014 2133   LABBENZ NONE DETECTED 04/04/2014 2133   AMPHETMU NONE DETECTED 04/04/2014 2133   THCU NONE DETECTED 04/04/2014 2133   LABBARB NONE DETECTED 04/04/2014 2133     Recent Labs Lab 04/04/14 1602  ETH <11    Ct Head Wo Contrast  04/04/2014   CLINICAL DATA:  Code stroke. Left-sided weakness, facial droop, and altered mental status.  EXAM: CT HEAD WITHOUT CONTRAST  TECHNIQUE: Contiguous axial images were obtained from  the base of the skull through the vertex without intravenous contrast.  COMPARISON:  08/05/2011  FINDINGS: There is abnormal hyper attenuation of the right MCA in the region of the distal M1 and proximal M2 segments. No definite acute cortical infarct is identified, although very early decreased attenuation in the right temporoparietal region is possible. There is mild generalized cerebral atrophy, unchanged. Periventricular white-matter hypodensities are similar to the prior study and nonspecific but compatible with mild chronic small vessel ischemic disease. More focal hypodensity in the anterior limb of the left internal capsule is unchanged and may reflect old lacunar infarct. There is no evidence of intracranial hemorrhage, mass, midline shift, or extra-axial fluid collection.  Prior bilateral cataract extraction is noted. Small right mastoid effusion is present. Mild left sphenoid sinus mucosal thickening is noted. Moderate carotid siphon calcification is present.  IMPRESSION: 1. Dense right MCA, concerning for acute thrombus in the distal M1/proximal M2 segments. No definite acute infarct is identified by CT. 2. No acute intracranial hemorrhage.  Critical Value/emergent results were called by telephone at the time of interpretation on 04/04/2014 at 4:19 pm to Dr. Leonel Ramsay, who verbally acknowledged these results.   Electronically Signed   By: Logan Bores   On: 04/04/2014 16:22    PHYSICAL EXAM Frail elderly lady not in distress.Awake alert. Afebrile. Head is nontraumatic. Neck is supple without bruit. Hearing is normal. Cardiac exam no murmur or gallop. Lungs are clear to auscultation. Distal pulses are well felt. Neurological Exam ; awake alert oriented x2. Mild dysarthria but can be easily understood. Mild expressive language difficulties. Follows one and two-step commands. Right gaze deviation. Left gaze palsy and unable to look to the left. Blinks to threat on the right but not on the left. Left  facial weakness. Tongue deviated to the left. Dense left upper extremity weakness with 0/5 strength. Grade 2-3/5 strength in the left lower extremity. Diminished sensation on the left. Mild left hemi-neglect. Left plantar upgoing without downgoing. NIH stroke scale score 10  ASSESSMENT/PLAN  [name] is a 80 y.o. female with history of atrial fibrillation on Coumadin therapy, permanent pacemaker, dementia, and hypertension presenting with right gaze preference, left-sided weakness, left facial droop, dysarthria, and was complaining of a headache. She did receive IV t-PA 04/04/2014 at 1644. Imaging supports a right cortical infarct. Stroke work up underway.  StrokeTIA:  Dense right MCA with probable right MCA cortical infarct s/p IV tPA, embolic secondary to known atrial fibrillation   warfarin prior to admission, INR 1.26 on admission, now on no antithrobmotics as within 24h of  tPA. If imaging negative for hemorrhage and pt able to swallow, start aspirin suppository 24h after tPA. Consider anticoagulation alternative at discharge.  CT - dense R MCA  CTA head at 24h  Carotid Doppler  pending  2D Echo  pending    LDL 64, no statin necessary as meeting goal of LDL <100  HgbA1c pending WNL  SCDs for VTE prophylaxis  NPO . ST to eval swallow  Bedrest  Resultant left UE>LE hemiparesis, left sided neurologic neglect, field cut  Therapy needs:  pending   Ongoing aggressive risk factor management  Disposition:  pending   Atrial Fibrillation  CHA2DS2-VASc Score = 7  (?2 oral anticoagulation recommended)   Home meds:  Coumadin, subtherapeutic at 1.26 on arrival   Hypertension   Home meds:  Verapamil, lopressor. Not resumed in hospital  BP 83-152/55-82 past 24h  SBP goal <180  Stable  Other Stroke Risk Factors Advanced age   Family hx stroke (father)  Other Active Problems  Hypertensive heart disease on lasix  Other Pertinent History  Baseline dementia,  lives at [facility]  Hx breast cancer  Hx bladder cancer  Pacemaker 2005  Depression  Hypothyroid on synthroid  Hospital day # 1  Burnetta Sabin, MSN, RN, ANVP-BC, ANP-BC, [facility] Pager: [phone] 04/05/2014 9:39 AM  I have personally examined this patient, reviewed notes, independently viewed imaging studies, participated in medical decision making and plan of care. I have made any additions or clarifications directly to the above note. Agree with note above.  This patient is critically ill and at significant risk of neurological worsening, death and care requires constant monitoring of vital signs, hemodynamics,respiratory and cardiac monitoring,review of multiple databases, neurological assessment, discussion with family, other specialists and medical decision making of high complexity.I have made any additions or clarifications directly to the above note.  I spent 35 minutes of neurocritical care time  in the care of  this patient.  Antony Contras, MD Medical Director [facility] Pager: [phone] 04/05/2014 2:28 PM    To contact Stroke Continuity provider, please refer to [email or web address] After hours, contact General Neurology

## 2014-04-05 NOTE — Progress Notes (Signed)
  Echocardiogram 2D Echocardiogram has been performed.  Heidi Harrell FRANCES 04/05/2014, 10:33 AM

## 2014-04-05 NOTE — Progress Notes (Signed)
Neurologist text paged to request asprin order.

## 2014-04-06 ENCOUNTER — Inpatient Hospital Stay (HOSPITAL_COMMUNITY): Payer: Medicare Other

## 2014-04-06 MED ORDER — LEVOTHYROXINE SODIUM 125 MCG PO TABS
125.0000 ug | ORAL_TABLET | Freq: Every day | ORAL | Status: DC
  Administered 2014-04-07 – 2014-04-09 (×3): 125 ug via ORAL
  Filled 2014-04-06 (×4): qty 1

## 2014-04-06 MED ORDER — POTASSIUM CHLORIDE 20 MEQ/15ML (10%) PO LIQD
20.0000 meq | Freq: Every day | ORAL | Status: DC
  Administered 2014-04-06 – 2014-04-09 (×4): 20 meq via ORAL
  Filled 2014-04-06 (×5): qty 15

## 2014-04-06 MED ORDER — VERAPAMIL HCL ER 300 MG PO CP24: 300.0000 mg | ORAL_CAPSULE | Freq: Every day | ORAL | Status: DC

## 2014-04-06 MED ORDER — METOPROLOL TARTRATE 1 MG/ML IV SOLN
5.0000 mg | Freq: Four times a day (QID) | INTRAVENOUS | Status: DC | PRN
  Administered 2014-04-06 – 2014-04-07 (×4): 5 mg via INTRAVENOUS
  Filled 2014-04-06 (×4): qty 5

## 2014-04-06 MED ORDER — VERAPAMIL HCL 80 MG PO TABS
80.0000 mg | ORAL_TABLET | Freq: Three times a day (TID) | ORAL | Status: DC
  Administered 2014-04-06 – 2014-04-09 (×8): 80 mg via ORAL
  Filled 2014-04-06 (×12): qty 1

## 2014-04-06 MED ORDER — RESOURCE THICKENUP CLEAR PO POWD
ORAL | Status: DC | PRN
  Filled 2014-04-06: qty 125

## 2014-04-06 MED ORDER — FLUTICASONE PROPIONATE 50 MCG/ACT NA SUSP
2.0000 | Freq: Every day | NASAL | Status: DC
  Administered 2014-04-06 – 2014-04-09 (×4): 2 via NASAL
  Filled 2014-04-06: qty 16

## 2014-04-06 MED ORDER — POTASSIUM CHLORIDE CRYS ER 20 MEQ PO TBCR: 20.0000 meq | EXTENDED_RELEASE_TABLET | Freq: Every day | ORAL | Status: DC

## 2014-04-06 MED ORDER — PANTOPRAZOLE SODIUM 40 MG PO TBEC: 40.0000 mg | DELAYED_RELEASE_TABLET | Freq: Every day | ORAL | Status: DC

## 2014-04-06 MED ORDER — POLYETHYLENE GLYCOL 3350 17 G PO PACK
17.0000 g | PACK | ORAL | Status: DC
  Administered 2014-04-06 – 2014-04-08 (×2): 17 g via ORAL
  Filled 2014-04-06 (×2): qty 1

## 2014-04-06 MED ORDER — VERAPAMIL HCL ER 180 MG PO TBCR
300.0000 mg | EXTENDED_RELEASE_TABLET | Freq: Every day | ORAL | Status: DC
  Filled 2014-04-06: qty 1

## 2014-04-06 MED ORDER — METOPROLOL TARTRATE 1 MG/ML IV SOLN
2.5000 mg | Freq: Four times a day (QID) | INTRAVENOUS | Status: DC | PRN
  Administered 2014-04-06: 2.5 mg via INTRAVENOUS

## 2014-04-06 MED ORDER — METOPROLOL TARTRATE 50 MG PO TABS
75.0000 mg | ORAL_TABLET | Freq: Two times a day (BID) | ORAL | Status: DC
  Administered 2014-04-06 – 2014-04-09 (×7): 75 mg via ORAL
  Filled 2014-04-06 (×8): qty 1

## 2014-04-06 MED ORDER — METOPROLOL TARTRATE 1 MG/ML IV SOLN
INTRAVENOUS | Status: AC
  Administered 2014-04-06: 2.5 mg via INTRAVENOUS
  Filled 2014-04-06: qty 5

## 2014-04-06 MED ORDER — FUROSEMIDE 40 MG PO TABS
40.0000 mg | ORAL_TABLET | Freq: Every day | ORAL | Status: DC
  Administered 2014-04-06 – 2014-04-09 (×4): 40 mg via ORAL
  Filled 2014-04-06 (×4): qty 1

## 2014-04-06 MED ORDER — SERTRALINE HCL 50 MG PO TABS
50.0000 mg | ORAL_TABLET | Freq: Every day | ORAL | Status: DC
  Administered 2014-04-06 – 2014-04-09 (×4): 50 mg via ORAL
  Filled 2014-04-06 (×4): qty 1

## 2014-04-06 MED ORDER — PANTOPRAZOLE SODIUM 40 MG PO PACK
40.0000 mg | PACK | Freq: Every day | ORAL | Status: DC
  Administered 2014-04-06 – 2014-04-09 (×4): 40 mg via ORAL
  Filled 2014-04-06 (×4): qty 20

## 2014-04-06 MED ORDER — HALOPERIDOL LACTATE 5 MG/ML IJ SOLN
2.0000 mg | Freq: Once | INTRAMUSCULAR | Status: AC
  Administered 2014-04-06: 2 mg via INTRAVENOUS
  Filled 2014-04-06: qty 1

## 2014-04-06 MED ORDER — POLYETHYLENE GLYCOL 3350 17 G PO PACK
4.2500 g | PACK | ORAL | Status: DC
  Filled 2014-04-06: qty 1

## 2014-04-06 NOTE — Progress Notes (Signed)
Patient agitated and continues to try and get out of bed and take off equipment. MD notified. Soft waist restraint ordered. Thayer Ohm D

## 2014-04-06 NOTE — Progress Notes (Signed)
Supervised and reviewed by Amarea Macdowell MA CCC-SLP  

## 2014-04-06 NOTE — Progress Notes (Signed)
OT Cancellation Note  Patient Details Name: Heidi Harrell MRN: [id number] DOB: October 25, 1918   Cancelled Treatment:    Reason Eval/Treat Not Completed: Patient not medically ready /nursing asking to hold due to HR. Will assess when appropriate  [facility], OTR/L  [phone] 04/06/2014 04/06/2014, 9:53 AM

## 2014-04-06 NOTE — Progress Notes (Signed)
PT Cancellation Note  Patient Details Name: Heidi Harrell MRN: [id number] DOB: 1919/05/02   Cancelled Treatment:    Reason Eval/Treat Not Completed: Medical issues which prohibited therapy.  As of this am, pt's HR is tachy in the 120's at rest.  Will try back as appropriate is pm. 04/06/2014  Donnella Sham, PT [phone]  (pager)  Heidi Harrell, [name] 04/06/2014, 9:50 AM

## 2014-04-06 NOTE — Evaluation (Signed)
Physical Therapy Evaluation Patient Details Name: Heidi Harrell MRN: [id number] DOB: 1919/02/19 Today's Date: 04/06/2014   History of Present Illness  Heidi Harrell is a 80 y.o. female who resides in the memory care unit at the [facility] in [place] with a history of atrial fibrillation on Coumadin therapy, permanent pacemaker, dementia, and hypertension. Yesterday the patient had a TIA they consisted of a facial droop which lasted approximately 2 minutes according to the patient's daughter. When this resolved she was back to baseline. Today at approximately 1510 the patient was noted to have left-sided weakness with dysarthria and a facial droop. Emergency services was contacted and the patient was brought to the [facility] emergency department by EMS. On arrival she had a right gaze preference, left-sided weakness, left facial droop, dysarthria, and was complaining of a headache. A stat CT scan was performed which revealed a dense right MCA, concerning for acute thrombus in the distal M1/proximal M2 segments but no definite acute infarct. The patient is known to have a DO NOT RESUSCITATE order; however, the patient's daughter would like to have her treated aggressively and has agreed to [facility] therapy.  TPA given,  Clinical Impression  Pt admitted with/for R CVA per CT s/p TPA.  Pt currently limited functionally due to the problems listed. ( See problems list.)   Pt will benefit from PT to maximize function and safety in order to get ready for next venue listed below.     Follow Up Recommendations SNF;Supervision for mobility/OOB    Equipment Recommendations  Other (comment) (TBA)    Recommendations for Other Services       Precautions / Restrictions Precautions Precautions: ICD/Pacemaker;Fall      Mobility  Bed Mobility Overal bed mobility: Needs Assistance Bed Mobility: Supine to Sit;Sit to Supine     Supine to sit: Min assist;HOB elevated Sit to  supine: Mod assist   General bed mobility comments: verbal/tactile guidance cues  to help pt follow direction and stay on task.  Transfers Overall transfer level: Needs assistance Equipment used: None Transfers: Sit to/from Stand Sit to Stand: Min assist         General transfer comment: cues for guidance.  Once up had pt take steps in place and spread out BOS.  Further assessment OOB was aborted due to rising HR  Ambulation/Gait             General Gait Details: not tested due to rising HR  Stairs            Wheelchair Mobility    Modified Rankin (Stroke Patients Only) Modified Rankin (Stroke Patients Only) Pre-Morbid Rankin Score: Moderately severe disability Modified Rankin: Moderately severe disability     Balance Overall balance assessment: Needs assistance Sitting-balance support: No upper extremity supported Sitting balance-Leahy Scale: Fair Sitting balance - Comments: unable to assess fully due to pt not able to stay on task     Standing balance-Leahy Scale: Poor                               Pertinent Vitals/Pain Pain Assessment: No/denies pain Faces Pain Scale: No hurt    Home Living Family/patient expects to be discharged to:: Other (Comment) ([facility])     Type of Home: Assisted living                Prior Function Level of Independence: Needs assistance  Gait / Transfers Assistance Needed: per daughters (not present) pt is taken "out and about" by family  Pt can walk with HHA, but lately they have used a walker with patient.           Hand Dominance        Extremity/Trunk Assessment   Upper Extremity Assessment: LUE deficits/detail       LUE Deficits / Details: As of 9/8, pt moved arm at wrist,elbow and to a lesser extent her shoulder in synergy.  She had a very weak grip and could not release.  Therapist had to get pt to attend to arm to get the most movement.   Lower Extremity  Assessment: LLE deficits/detail   LLE Deficits / Details: moves in synergy, grossly 4-/5     Communication      Cognition     Overall Cognitive Status: History of cognitive impairments - at baseline                      General Comments      Exercises        Assessment/Plan    PT Assessment Patient needs continued PT services  PT Diagnosis Difficulty walking;Hemiplegia non-dominant side (LUE worse than L LE)   PT Problem List Decreased strength;Decreased activity tolerance;Decreased balance;Decreased mobility;Decreased coordination;Decreased safety awareness;Decreased cognition  PT Treatment Interventions Gait training;Functional mobility training;Therapeutic activities;Balance training;Neuromuscular re-education;Patient/family education   PT Goals (Current goals can be found in the Care Plan section) Acute Rehab PT Goals Patient Stated Goal: pt unable to state Time For Goal Achievement: 04/20/14 Potential to Achieve Goals: Good    Frequency Min 3X/week   Barriers to discharge        Co-evaluation               End of Session   Activity Tolerance: Patient tolerated treatment well Patient left: in bed;with call bell/phone within reach;with bed alarm set Nurse Communication: Mobility status         Time: 1535-1600 PT Time Calculation (min): 25 min   Charges:   PT Evaluation $Initial PT Evaluation Tier I: 1 Procedure PT Treatments $Therapeutic Activity: 8-22 mins   PT G Codes:          Jeris Roser, [name] 04/06/2014, 4:14 PM 04/06/2014  Donnella Sham, PT [phone]  (pager)

## 2014-04-06 NOTE — Progress Notes (Signed)
Speech Language Pathology Treatment: Dysphagia;Cognitive-Linquistic  Patient Details Name: Heidi Harrell MRN: [id number] DOB: September 07, 1918 Today's Date: 04/06/2014 Time: 4665-9935 SLP Time Calculation (min): 30 min  Assessment / Plan / Recommendation Clinical Impression  Pt demonstrates overt signs of aspiration with PO trials (coughing, wet voice). She is also unable to independently manage her secretions and required suction. Recommend objective evaluation to better assess swallow function and determine the most appropriate diet. Her sustained attention during functional tasks improves with minimal verbal, tactile, and/or contextual cues.Recommend further treatment to focus on cognition as well as speech intelligibility which is still reduced due to weakness and lethargy.   HPI HPI: 80 y.o. female who resides in the memory care unit at the [facility] in [place] with a history of atrial fibrillation on Coumadin therapy, permanent pacemaker, dementia, GERD. and hypertension. Admitted 9/6 with left sided weakness, facial droop, dysarthria and c/o headache. CT can revealed a dense rigth MCA CVA.  S/p tPA.   Pertinent Vitals Pain Assessment: No/denies pain  SLP Plan  MBS    Recommendations                Plan: MBS    GO     Heidi Harrell 04/06/2014, 1:28 PM

## 2014-04-06 NOTE — Progress Notes (Signed)
*  PRELIMINARY RESULTS* Vascular Ultrasound Carotid Duplex (Doppler) has been completed.  Preliminary findings: Bilateral:  1-39% ICA stenosis.  Vertebral artery flow is antegrade.      Landry Mellow, RDMS, RVT  04/06/2014, 10:10 AM

## 2014-04-06 NOTE — Progress Notes (Signed)
Occupational Therapy Evaluation Patient Details Name: Heidi Harrell MRN: [id number] DOB: 10/05/1918 Today's Date: 04/06/2014    History of Present Illness Heidi Harrell is a 80 y.o. female who resides in the memory care unit at the [facility].Pt with  dementia and hypertension. The patient was noted to have left-sided weakness with dysarthria and a facial droop. EmergencyOn arrival she had a right gaze preference, left-sided weakness, left facial droop, dysarthria. CT scan was performed which revealed a dense right MCA, concerning for acute thrombus in the distal M1/proximal M2 segments but no definite acute infarct.  TPA given,   Clinical Impression   PTA, pt lived at [facility] unit and per daughter, was mod I to S with mobility and ADL. Pt will need SNF for rehab to facilitate return to memory care unit. Daughter in agreement to SNF for rehab.  Pt will benefit from skilled OT services to facilitate D/C to next venue due to below deficits.    Follow Up Recommendations  SNF;Supervision/Assistance - 24 hour    Equipment Recommendations  None recommended by OT    Recommendations for Other Services       Precautions / Restrictions Precautions Precautions: ICD/Pacemaker;Fall  L neglect     Mobility Bed Mobility Overal bed mobility: Needs Assistance Bed Mobility: Supine to Sit;Sit to Supine     Supine to sit: Min assist;HOB elevated Sit to supine: Mod assist   General bed mobility comments: poor awareness of LT side of body during mobility        General transfer comment: not assessed due to lethargy/fatigu. Per PT, pt min  A to stand    Balance Overall balance assessment: Needs assistance Sitting-balance support: No upper extremity supported Sitting balance-Leahy Scale: Fair Sitting balance - Comments: unable to assess fully due to pt not able to stay on task     Standing balance-Leahy Scale: Poor                               ADL Overall ADL's : Needs assistance/impaired                Max A for bathing/dressing.     Grooming  Max A Self feeding not assessed cue to lethargy      functional transfers not assessed. Foley. Pt continued to ask to go to the bathroom. Daughter reports that pt toileted herself independetly PTA.                   Vision                 Additional Comments: appears to have LT field cut   Perception Perception Spatial deficits: LT neglect. overshoots when reaching for objects   Praxis Praxis Praxis tested?: Deficits Deficits: Perseveration    Pertinent Vitals/Pain Pain Assessment: No/denies pain Faces Pain Scale: No hurt     Hand Dominance Right   Extremity/Trunk Assessment Upper Extremity Assessment Upper Extremity Assessment: LUE deficits/detail LUE Deficits / Details: isolated movement LUE out of synergy pattern. Trys to use LUE functionally when towel placed in arm - able to grasp towel and attempt to wipe facePoor awareness of LUE.  LUE Sensation: decreased light touch;decreased proprioception LUE Coordination: decreased fine motor;decreased gross motor   Lower Extremity Assessment Lower Extremity Assessment: Defer to PT evaluation LLE Deficits / Details: moves in synergy, grossly 4-/5       Communication  Communication Communication: Expressive difficulties (dysarthric)   Cognition Arousal/Alertness: Lethargic Behavior During Therapy: Flat affect Overall Cognitive Status: Impaired/Different from baseline Area of Impairment: Attention;Awareness;Problem solving   Current Attention Level: Selective Memory: Decreased recall of precautions;Decreased short-term memory (history of cognitive deficits. Pt not oriented to place, or )     Awareness: Emergent Problem Solving: Slow processing;Difficulty sequencing;Requires verbal cues     General Comments       Exercises Exercises: Other exercises Other Exercises Other Exercises:  Educated daughter on LT neglect and increasing attention to LT Other Exercises: Educated daughter on use of simple funcitonal tasks to increase functional use and awareness of LUE   Shoulder Instructions      Home Living Family/patient expects to be discharged to:: Skilled nursing facility     Type of Home: Assisted living                                  Prior Functioning/Environment Level of Independence: Needs assistance  Gait / Transfers Assistance Needed: per daughters (not present) pt is taken "out and about" by family  Pt can walk with HHA, but lately they have used a walker with patient. ADL's / Homemaking Assistance Needed: pt was independent with bathing/dressing and then staff assisted with a "good bath" 3x/wk. Able to self feed Communication / Swallowing Assistance Needed: dysarthric Comments: Daughter states her mother was very independent and "mobile" but rarely was oriented to place or time.     OT Diagnosis: Generalized weakness;Cognitive deficits;Disturbance of vision;Hemiplegia non-dominant side   OT Problem List: Decreased strength;Decreased range of motion;Decreased activity tolerance;Impaired balance (sitting and/or standing);Impaired vision/perception;Decreased coordination;Decreased cognition;Decreased safety awareness;Decreased knowledge of use of DME or AE;Decreased knowledge of precautions;Cardiopulmonary status limiting activity;Impaired sensation;Impaired tone;Impaired UE functional use   OT Treatment/Interventions: Self-care/ADL training;Therapeutic exercise;Neuromuscular education;DME and/or AE instruction;Therapeutic activities;Cognitive remediation/compensation;Visual/perceptual remediation/compensation;Patient/family education;Balance training    OT Goals(Current goals can be found in the care plan section) Acute Rehab OT Goals Patient Stated Goal: to go to the bathroom OT Goal Formulation: With patient/family Time For Goal Achievement:  04/20/14 Potential to Achieve Goals: Good  OT Frequency: Min 2X/week   Barriers to D/C:            Co-evaluation              End of Session Nurse Communication: Mobility status;Other (comment) (need for LUE positioning on pillos and at midline)  Activity Tolerance: Patient limited by lethargy Patient left: in bed;with call bell/phone within reach;with family/visitor present   Time: 6283-1517 OT Time Calculation (min): 28 min Charges:  OT General Charges $OT Visit: 1 Procedure OT Evaluation $Initial OT Evaluation Tier I: 1 Procedure OT Treatments $Self Care/Home Management : 8-22 mins $Therapeutic Activity: 8-22 mins G-Codes:    Brieann Osinski,HILLARY 04-22-2014, 5:17 PM   [facility], OTR/L  [phone] 04/22/14

## 2014-04-06 NOTE — Procedures (Signed)
Objective Swallowing Evaluation: Modified Barium Swallowing Study  Patient Details  Name: Heidi Harrell MRN: [id number] Date of Birth: [birth year]  Today's Date: 04/06/2014 Time: 1517-6160 SLP Time Calculation (min): 28 min  Past Medical History:  Past Medical History  Diagnosis Date  . Dementia   . Hypothyroid   . GERD (gastroesophageal reflux disease)   . Atrial fibrillation   . Osteoporosis   . Depression   . Bladder cancer   . Breast cancer   . Hypertensive heart disease   . Presence of permanent cardiac pacemaker 09/20/2003    Medtronic EnPulse  . LBBB (left bundle branch block)    Past Surgical History:  Past Surgical History  Procedure Laterality Date  . Permanent pacemaker insertion  09/20/2003    Medtronic EnPulse  . Cardiac catheterization  09/20/2003    patent coronary arteries  . US echocardiography  01/15/2012    EF 45-50%,AL severley dilated,mod. mitral annular ca+,mild MR,mod TR,AOV mod. sclerotic  . Permanent pacemaker generator change  02/10/2013    Medtronic   HPI:  80 y.o. female who resides in the memory care unit at the [facility] in [place] with a history of atrial fibrillation on Coumadin therapy, permanent pacemaker, dementia, GERD. and hypertension. Admitted 9/6 with left sided weakness, facial droop, dysarthria and c/o headache. CT can revealed a dense rigth MCA CVA.  S/p tPA.     Assessment / Plan / Recommendation Clinical Impression  Dysphagia Diagnosis: Moderate oral phase dysphagia;Mild pharyngeal phase dysphagia;Moderate pharyngeal phase dysphagia  Clinical impression: Pt presents with a moderate oropharyngeal dysphagia that is largely impacted by cognitive impairments, including decreased sustained attention and bolus awareness. Oral holding was prevelant throughout trials, as was anterior bolus loss. Weak lingual manipulation and transit led to left buccal pocketing, lingual residue, and premature spillage, without patient  awareness of the above. Sensed aspiration was observed with premature spillage of nectar thick liquids, which patient was unable to fully clear despite strong, reflexive cough. Intermittent, silent penetration with honey thick liquids occurred during the swallow and after, due to mild residuals remaining in the valleculae. Given fluctuating attention and alertness, recommend to proceed with more conservative diet of Dys 1 textures and pudding thick liquids. With continued improvement in alertness and awareness, prognosis for diet advancement is good.    Treatment Recommendation  Therapy as outlined in treatment plan below    Diet Recommendation Dysphagia 1 (Puree);Pudding-thick liquid   Liquid Administration via: Spoon Medication Administration: Crushed with puree Supervision: Staff to assist with self feeding;Full supervision/cueing for compensatory strategies Compensations: Slow rate;Small sips/bites;Check for pocketing;Check for anterior loss Postural Changes and/or Swallow Maneuvers: Seated upright 90 degrees    Other  Recommendations Oral Care Recommendations: Oral care before and after PO Other Recommendations: Order thickener from pharmacy;Prohibited food (jello, ice cream, thin soups);Remove water pitcher   Follow Up Recommendations  Skilled Nursing facility    Frequency and Duration min 3x week  2 weeks   Pertinent Vitals/Pain n/a    SLP Swallow Goals     General Date of Onset: 04/04/14 HPI: 80 y.o. female who resides in the memory care unit at the [facility] in [place] with a history of atrial fibrillation on Coumadin therapy, permanent pacemaker, dementia, GERD. and hypertension. Admitted 9/6 with left sided weakness, facial droop, dysarthria and c/o headache. CT can revealed a dense rigth MCA CVA.  S/p tPA. Type of Study: Modified Barium Swallowing Study Reason for Referral: Objectively evaluate swallowing function Previous Swallow  Assessment: BSE 9/7  recommending NPO due to decreased arousal and alertness Diet Prior to this Study: NPO Temperature Spikes Noted: No Respiratory Status: Room air History of Recent Intubation: No Behavior/Cognition: Lethargic;Cooperative;Pleasant mood;Confused;Decreased sustained attention Oral Cavity - Dentition: Adequate natural dentition Oral Motor / Sensory Function: Impaired - see Bedside swallow eval Self-Feeding Abilities: Needs assist Patient Positioning: Upright in chair (head naturally in chin tuck position) Baseline Vocal Quality: Clear Volitional Swallow: Able to elicit Anatomy: Within functional limits Pharyngeal Secretions: Not observed secondary MBS    Reason for Referral Objectively evaluate swallowing function   Oral Phase Oral Preparation/Oral Phase Oral Phase: Impaired Oral - Honey Oral - Honey Teaspoon: Left anterior bolus loss;Weak lingual manipulation;Reduced posterior propulsion;Holding of bolus;Left pocketing in lateral sulci;Pocketing in anterior sulcus;Lingual/palatal residue;Delayed oral transit Oral - Honey Cup: Left anterior bolus loss;Weak lingual manipulation;Reduced posterior propulsion;Holding of bolus;Left pocketing in lateral sulci;Pocketing in anterior sulcus;Lingual/palatal residue;Delayed oral transit Oral - Nectar Oral - Nectar Teaspoon: Left anterior bolus loss;Weak lingual manipulation;Reduced posterior propulsion;Holding of bolus;Left pocketing in lateral sulci;Pocketing in anterior sulcus;Lingual/palatal residue;Delayed oral transit;Other (Comment) (almost entire bolus lost from mouth) Oral - Nectar Cup: Left anterior bolus loss;Weak lingual manipulation;Reduced posterior propulsion;Holding of bolus;Left pocketing in lateral sulci;Pocketing in anterior sulcus;Lingual/palatal residue;Delayed oral transit Oral - Solids Oral - Puree: Left anterior bolus loss;Weak lingual manipulation;Reduced posterior propulsion;Holding of bolus;Left pocketing in lateral sulci;Pocketing  in anterior sulcus;Lingual/palatal residue;Delayed oral transit Oral Phase - Comment Oral Phase - Comment: largely impacted by decreased sustained attention   Pharyngeal Phase Pharyngeal Phase Pharyngeal Phase: Impaired Pharyngeal - Honey Pharyngeal - Honey Teaspoon: Premature spillage to valleculae;Reduced pharyngeal peristalsis;Reduced tongue base retraction;Penetration/Aspiration after swallow;Pharyngeal residue - valleculae Penetration/Aspiration details (honey teaspoon): Material enters airway, remains ABOVE vocal cords and not ejected out Pharyngeal - Honey Cup: Premature spillage to valleculae;Reduced pharyngeal peristalsis;Reduced tongue base retraction;Penetration/Aspiration after swallow;Pharyngeal residue - valleculae Penetration/Aspiration details (honey cup): Material enters airway, remains ABOVE vocal cords and not ejected out Pharyngeal - Nectar Pharyngeal - Nectar Cup: Premature spillage to valleculae;Reduced pharyngeal peristalsis;Reduced tongue base retraction;Pharyngeal residue - valleculae;Penetration/Aspiration before swallow;Moderate aspiration Penetration/Aspiration details (nectar cup): Material enters airway, passes BELOW cords and not ejected out despite cough attempt by patient Pharyngeal - Solids Pharyngeal - Puree: Premature spillage to valleculae;Reduced pharyngeal peristalsis;Reduced tongue base retraction;Pharyngeal residue - valleculae Penetration/Aspiration details (puree): Material does not enter airway  Cervical Esophageal Phase    GO    Cervical Esophageal Phase Cervical Esophageal Phase: [facility]        Germain Osgood, M.A. CCC-SLP [phone]  Germain Osgood 04/06/2014, 2:42 PM

## 2014-04-06 NOTE — Progress Notes (Signed)
STROKE TEAM PROGRESS NOTE   HISTORY Heidi Harrell is a 80 y.o. female who resides in the memory care unit at the [facility] in [place] with a history of atrial fibrillation on Coumadin therapy, permanent pacemaker, dementia, and hypertension. Yesterday 04/03/2014 the patient had a TIA that consisted of a facial droop which lasted approximately 2 minutes according to the patient's daughter. When this resolved she was back to baseline. Today 04/04/2014 at approximately 1510 the patient was noted to have left-sided weakness with dysarthria and a facial droop. Emergency services was contacted and the patient was brought to the [facility] emergency department by EMS. On arrival she had a right gaze preference, left-sided weakness, left facial droop, dysarthria, and was complaining of a headache. A stat CT scan was performed which revealed a dense right MCA, concerning for acute thrombus in the distal M1/proximal M2 segments but no definite acute infarct. The patient is known to have a DO NOT RESUSCITATE order; however, the patient's daughter would like to have her treated aggressively and has agreed to [facility] therapy if the patient is an appropriate candidate. An INR is pending at the time of this dictation. Her NIH scale on admission was 13 on admission. Patient was administered TPA. She was admitted to the neuro ICU for further evaluation and treatment.   SUBJECTIVE (INTERVAL HISTORY) Now following commands, moving left leg off the bed, left arm still plegic.remained stable overnight. Failed swallow exam y`day. Followup CT showed large left MCA moderate sized non-hemorrhagic infarct .   OBJECTIVE Temp:  [97.4 F (36.3 C)-98.4 F (36.9 C)] 98 F (36.7 C) (09/08 0756) Pulse Rate:  [87-118] 106 (09/08 0700) Cardiac Rhythm:  [-] Atrial fibrillation (09/07 2000) Resp:  [11-22] 19 (09/08 0700) BP: (110-149)/(46-98) 136/75 mmHg (09/08 0700) SpO2:  [91 %-100 %] 96 % (09/08  0700)    Recent Labs Lab 04/04/14 1602 04/04/14 1608 04/04/14 1830 04/04/14 2225 04/05/14 0258  NA 139 138  --   --  139  K 2.9* 2.6* 3.4* 3.5* 3.9  CL 98 99  --   --  103  CO2 26  --   --   --  24  GLUCOSE 150* 155*  --   --  106*  BUN 22 21  --   --  16  CREATININE 1.06 1.10  --   --  0.86  CALCIUM 8.9  --   --   --  8.4    Recent Labs Lab 04/04/14 1602  AST 27  ALT 16  ALKPHOS 103  BILITOT 0.3  PROT 7.3  ALBUMIN 3.5    Recent Labs Lab 04/04/14 1602 04/04/14 1608 04/05/14 0258  WBC 6.0  --  9.9  NEUTROABS 3.6  --   --   HGB 11.2* 11.9* 10.9*  HCT 33.5* 35.0* 32.8*  MCV 101.5*  --  102.5*  PLT 314  --  301   No results found for this basename: CKTOTAL, CKMB, CKMBINDEX, TROPONINI,  in the last 168 hours  Recent Labs  04/04/14 1602  LABPROT 15.8*  INR 1.26    Recent Labs  04/04/14 2133  COLORURINE YELLOW  LABSPEC 1.011  PHURINE 7.0  GLUCOSEU NEGATIVE  HGBUR MODERATE*  BILIRUBINUR NEGATIVE  KETONESUR NEGATIVE  PROTEINUR NEGATIVE  UROBILINOGEN 0.2  NITRITE NEGATIVE  LEUKOCYTESUR NEGATIVE       Component Value Date/Time   CHOL 134 04/05/2014 0258   TRIG 32 04/05/2014 0258   HDL 64 04/05/2014 0258  CHOLHDL 2.1 04/05/2014 0258   VLDL 6 04/05/2014 0258   LDLCALC 64 04/05/2014 0258   Lab Results  Component Value Date   HGBA1C 5.6 04/05/2014      Component Value Date/Time   LABOPIA NONE DETECTED 04/04/2014 2133   COCAINSCRNUR NONE DETECTED 04/04/2014 2133   LABBENZ NONE DETECTED 04/04/2014 2133   AMPHETMU NONE DETECTED 04/04/2014 2133   THCU NONE DETECTED 04/04/2014 2133   LABBARB NONE DETECTED 04/04/2014 2133     Recent Labs Lab 04/04/14 1602  ETH <11    Ct Angio Head W/cm &/or Wo Cm 04/05/2014    1. Evolving acute right MCA territory infarct. No intracranial hemorrhage. 2. Right M1 segment and right MCA bifurcation are patent. Moderate to severe proximal right M2 branch vessel stenosis.     Ct Head Wo Contrast 04/04/2014    1. Dense right MCA,  concerning for acute thrombus in the distal M1/proximal M2 segments. No definite acute infarct is identified by CT. 2. No acute intracranial hemorrhage.     PHYSICAL EXAM Frail elderly lady not in distress.Awake alert. Afebrile. Head is nontraumatic. Neck is supple without bruit. Hearing is normal. Cardiac exam no murmur or gallop. Lungs are clear to auscultation. Distal pulses are well felt. Neurological Exam ; awake alert oriented x2. Mild dysarthria but can be easily understood. Mild expressive language difficulties. Follows one and two-step commands. Right gaze deviation. Left gaze palsy and able to partially look to the left. Blinks to threat on the right but not on the left. Left facial weakness. Tongue deviated to the left. Dense left upper extremity weakness with 0/5 strength. Grade 2-3/5 strength in the left lower extremity. Diminished sensation on the left. Mild left hemi-neglect. Left plantar upgoing without downgoing. NIH stroke scale score 13   ASSESSMENT/PLAN [name] is a 80 y.o. female with history of atrial fibrillation on Coumadin therapy, permanent pacemaker, dementia, and hypertension presenting with right gaze preference, left-sided weakness, left facial droop, dysarthria, and was complaining of a headache. She did receive IV t-PA 04/04/2014 at 1644. Imaging confirms a R MCA infarct. Stroke work up underway.  StrokeTIA:  Dense right MCA with right MCA cortical infarct s/p IV tPA, embolic secondary to known atrial fibrillation   warfarin prior to admission, INR 1.26 on admission, now on aspirin 300 mg suppository. Consider anticoagulation alternative at discharge.  CT - dense R MCA  CTA head - R MCA territory infarct, no lg vessel occlusion  Carotid Doppler  pending  2D Echo  - no source of embolus  LDL 64, no statin necessary as meeting goal of LDL <100  HgbA1c 5.6 WNL  SCDs for VTE prophylaxis  NPO. Failed swallow eval. ST following, will  reassess.  Progress to OOB  Resultant left UE>LE hemiparesis, left sided neurologic neglect, field cut  Therapy needs:  pending   Ongoing aggressive risk factor management  Continue ICU level care due to tachycardia and need for prn metoprolol. Consider transfer to the floor if she passes swallow and home meds resumed  Disposition:  pending   Atrial Fibrillation  RVR as unable to take po meds; using IV prn meds  CHA2DS2-VASc Score = 7  (?2 oral anticoagulation recommended)   Home meds:  Coumadin, subtherapeutic at 1.26 on arrival   Hypertension   Home meds:  Verapamil, lopressor. Not resumed in hospital BP 110-149/75-87 past 24h (04/06/2014 @ 8:24 AM)  SBP goal <180  Stable  Other Stroke Risk Factors Advanced age  Family hx stroke (father)  Other Active Problems  Hypertensive heart disease on lasix  Other Pertinent History  Baseline dementia, lives at [place]  Hx breast cancer  Hx bladder cancer  Pacemaker 2005  Depression  Hypothyroid on [facility] day # 2  Burnetta Sabin, MSN, RN, ANVP-BC, ANP-BC, [facility] Pager: [phone] 04/06/2014 8:18 AM  I have personally examined this patient, reviewed notes, independently viewed imaging studies, participated in medical decision making and plan of care. I have made any additions or clarifications directly to the above note. Agree with note above.  I had a long discussion with the patient and family members at the bedside regarding her prognosis, reviewed results of available tests and answered questions. Plan to repeat swallow eval today and is able to swallow start home medications. And transfer  to the floor.  Antony Contras, MD Medical Director [facility] Pager: [phone] 04/06/2014 9:55 AM    To contact Stroke Continuity provider, please refer to [email or web address] After hours, contact General Neurology

## 2014-04-06 NOTE — Progress Notes (Signed)
Patient agitated and continues to pull at equipment. Her HR is sustained in the 130s-140s despite PRN lopressor. Dr. Nicole Kindred notified. 2mg  Haldol ordered. Thayer Ohm D

## 2014-04-07 ENCOUNTER — Ambulatory Visit (INDEPENDENT_AMBULATORY_CARE_PROVIDER_SITE_OTHER): Payer: Medicare Other | Admitting: Pharmacist Clinician (PhC)/ Clinical Pharmacy Specialist

## 2014-04-07 DIAGNOSIS — Z7901 Long term (current) use of anticoagulants: Secondary | ICD-10-CM

## 2014-04-07 MED ORDER — APIXABAN 2.5 MG PO TABS
2.5000 mg | ORAL_TABLET | Freq: Two times a day (BID) | ORAL | Status: DC
  Administered 2014-04-07 – 2014-04-08 (×3): 2.5 mg via ORAL
  Filled 2014-04-07 (×5): qty 1

## 2014-04-07 MED ORDER — HALOPERIDOL LACTATE 5 MG/ML IJ SOLN
INTRAMUSCULAR | Status: AC
  Filled 2014-04-07: qty 1

## 2014-04-07 MED ORDER — HALOPERIDOL LACTATE 5 MG/ML IJ SOLN
5.0000 mg | Freq: Once | INTRAMUSCULAR | Status: AC
  Administered 2014-04-07: 5 mg via INTRAVENOUS

## 2014-04-07 NOTE — Progress Notes (Signed)
Physical Therapy Treatment Patient Details Name: Heidi Harrell MRN: [id number] DOB: 1918-09-06 Today's Date: 04/07/2014    History of Present Illness Heidi Harrell is a 80 y.o. female who resides in the memory care unit at the [facility].Pt with  dementia and hypertension. The patient was noted to have left-sided weakness with dysarthria and a facial droop. EmergencyOn arrival she had a right gaze preference, left-sided weakness, left facial droop, dysarthria. CT scan was performed which revealed a dense right MCA, concerning for acute thrombus in the distal M1/proximal M2 segments but no definite acute infarct.  TPA given,    PT Comments    Progressing slowly limited mostly by dementia and need for constant redirection.  Follow Up Recommendations  SNF;Supervision for mobility/OOB     Equipment Recommendations  Other (comment)    Recommendations for Other Services       Precautions / Restrictions Precautions Precautions: ICD/Pacemaker;Fall    Mobility  Bed Mobility Overal bed mobility: Needs Assistance Bed Mobility: Supine to Sit;Sit to Supine     Supine to sit: Min assist Sit to supine: Min assist   General bed mobility comments: not using L UE to help assist to EOB  Transfers Overall transfer level: Needs assistance Equipment used: 2 person hand held assist Transfers: Sit to/from Stand Sit to Stand: Min assist;+2 physical assistance         General transfer comment: sit to stand x3; cues for hand placement and getting ready to stand.  Ambulation/Gait Ambulation/Gait assistance: Min assist;+2 physical assistance Ambulation Distance (Feet): 2 Feet (sidestepping toward [facility]) Assistive device: 2 person hand held assist       General Gait Details: assisted w/shift to L Le for 4-5 side steps to the R toward HOB.   Stairs            Wheelchair Mobility    Modified Rankin (Stroke Patients Only) Modified Rankin (Stroke Patients  Only) Modified Rankin: Moderately severe disability     Balance Overall balance assessment: Needs assistance Sitting-balance support: Feet supported;Single extremity supported Sitting balance-Leahy Scale: Fair     Standing balance support: Bilateral upper extremity supported Standing balance-Leahy Scale: Poor Standing balance comment: sit to stand x3, w/shift work with marching in place, before sidestepping up to [facility].                    Cognition Arousal/Alertness: Lethargic Behavior During Therapy: Flat affect Overall Cognitive Status: Impaired/Different from baseline Area of Impairment: Attention;Awareness;Problem solving   Current Attention Level: Selective Memory: Decreased recall of precautions;Decreased short-term memory       Problem Solving: Slow processing;Difficulty sequencing;Requires verbal cues      Exercises      General Comments        Pertinent Vitals/Pain Pain Assessment: No/denies pain    Home Living                      Prior Function            PT Goals (current goals can now be found in the care plan section) Acute Rehab PT Goals Patient Stated Goal: Want to go to bathroom Time For Goal Achievement: 04/20/14 Potential to Achieve Goals: Good Progress towards PT goals: Progressing toward goals    Frequency  Min 3X/week    PT Plan Current plan remains appropriate    Co-evaluation             End of Session   Activity  Tolerance: Patient tolerated treatment well Patient left: in bed;with call bell/phone within reach;with nursing/sitter in room     Time: 1407-1430 PT Time Calculation (min): 23 min  Charges:  $Therapeutic Activity: 23-37 mins                    G Codes:      Somara Frymire, [name] 04/07/2014, 4:27 PM 04/07/2014  Donnella Sham, PT [phone]  (pager)

## 2014-04-07 NOTE — Clinical Social Work Note (Signed)
Clinical Social Work Department BRIEF PSYCHOSOCIAL ASSESSMENT 04/07/2014  Patient:  Heidi Harrell, Heidi Harrell     Account Number:  [id number]     Admit date:  04/04/2014  Clinical Social Worker:  Myles Lipps  Date/Time:  04/07/2014 11:00 AM  Referred by:  RN  Date Referred:  04/07/2014 Referred for  SNF Placement   Other Referral:   Interview type:  Family Other interview type:   Spoke with patient daughter, Heidi Harrell, at bedside    PSYCHOSOCIAL DATA Living Status:  FACILITY Admitted from facility:  [facility] Level of care:  Assisted Living Primary support name:  Heidi Harrell  [phone] Primary support relationship to patient:  CHILD, ADULT Degree of support available:   Strong    CURRENT CONCERNS Current Concerns  Post-Acute Placement   Other Concerns:    SOCIAL WORK ASSESSMENT / PLAN Clinical Social Worker met with patient daughter at bedside to offer support and discuss patient needs at discharge. Patient daughter states that patient is a current resident at [facility] and would like for patient to return if possible.  Per PT recommendations, patient will likely need SNF placement at discharge.  Patient daughter agreeable with SNF search in [place].  CSW to initiate SNF search in [facility] and follow up with patient family regarding available bed offers.  CSW remains available for support and to facilitate patient discharge needs once medically ready.   Assessment/plan status:  Psychosocial Support/Ongoing Assessment of Needs Other assessment/ plan:   Information/referral to community resources:   Patient daughter states that [facility] representative coming to assess patient today for potential return, however agrees that patient will need a higher level of care.  CSW to provide patient daughter with facility list.    PATIENT'S/FAMILY'S RESPONSE TO PLAN OF CARE: Patient alert and oriented to self only at this time. Patient with good family  support.  Patient family understanding of CSW role and agreeable with SNF placement at discharge if unable to return to [facility].  Patient daughter verbalized her appreciation for CSW support and involvement.

## 2014-04-07 NOTE — Progress Notes (Signed)
STROKE TEAM PROGRESS NOTE   HISTORY Heidi Harrell is a 80 y.o. female who resides in the memory care unit at the [facility] in [place] with a history of atrial fibrillation on Coumadin therapy, permanent pacemaker, dementia, and hypertension. Yesterday 04/03/2014 the patient had a TIA that consisted of a facial droop which lasted approximately 2 minutes according to the patient's daughter. When this resolved she was back to baseline. Today 04/04/2014 at approximately 1510 the patient was noted to have left-sided weakness with dysarthria and a facial droop. Emergency services was contacted and the patient was brought to the [facility] emergency department by EMS. On arrival she had a right gaze preference, left-sided weakness, left facial droop, dysarthria, and was complaining of a headache. A stat CT scan was performed which revealed a dense right MCA, concerning for acute thrombus in the distal M1/proximal M2 segments but no definite acute infarct. The patient is known to have a DO NOT RESUSCITATE order; however, the patient's daughter would like to have her treated aggressively and has agreed to [facility] therapy if the patient is an appropriate candidate. An INR is pending at the time of this dictation. Her NIH scale on admission was 13 on admission. Patient was administered TPA. She was admitted to the neuro ICU for further evaluation and treatment.   SUBJECTIVE (INTERVAL HISTORY) Now following commands, moving left leg off the bed, left arm still plegic.remained stable overnight. Passed swallow exam y`day. Now on Dysphagia 1 diet.  . Family is able to rehabilitation in a skilled nursing facility and social work consulted   OBJECTIVE Temp:  [98 F (36.7 C)-99.4 F (37.4 C)] 98.9 F (37.2 C) (09/09 1127) Pulse Rate:  [87-132] 99 (09/09 1200) Cardiac Rhythm:  [-] Atrial fibrillation (09/09 1200) Resp:  [16-39] 18 (09/09 1200) BP: (124-164)/(58-105) 137/90 mmHg (09/09  1200) SpO2:  [87 %-96 %] 96 % (09/09 1200)    Recent Labs Lab 04/04/14 1602 04/04/14 1608 04/04/14 1830 04/04/14 2225 04/05/14 0258  NA 139 138  --   --  139  K 2.9* 2.6* 3.4* 3.5* 3.9  CL 98 99  --   --  103  CO2 26  --   --   --  24  GLUCOSE 150* 155*  --   --  106*  BUN 22 21  --   --  16  CREATININE 1.06 1.10  --   --  0.86  CALCIUM 8.9  --   --   --  8.4    Recent Labs Lab 04/04/14 1602  AST 27  ALT 16  ALKPHOS 103  BILITOT 0.3  PROT 7.3  ALBUMIN 3.5    Recent Labs Lab 04/04/14 1602 04/04/14 1608 04/05/14 0258  WBC 6.0  --  9.9  NEUTROABS 3.6  --   --   HGB 11.2* 11.9* 10.9*  HCT 33.5* 35.0* 32.8*  MCV 101.5*  --  102.5*  PLT 314  --  301   No results found for this basename: CKTOTAL, CKMB, CKMBINDEX, TROPONINI,  in the last 168 hours  Recent Labs  04/04/14 1602  LABPROT 15.8*  INR 1.26    Recent Labs  04/04/14 2133  COLORURINE YELLOW  LABSPEC 1.011  PHURINE 7.0  GLUCOSEU NEGATIVE  HGBUR MODERATE*  BILIRUBINUR NEGATIVE  KETONESUR NEGATIVE  PROTEINUR NEGATIVE  UROBILINOGEN 0.2  NITRITE NEGATIVE  LEUKOCYTESUR NEGATIVE       Component Value Date/Time   CHOL 134 04/05/2014 0258   TRIG  32 04/05/2014 0258   HDL 64 04/05/2014 0258   CHOLHDL 2.1 04/05/2014 0258   VLDL 6 04/05/2014 0258   LDLCALC 64 04/05/2014 0258   Lab Results  Component Value Date   HGBA1C 5.6 04/05/2014      Component Value Date/Time   LABOPIA NONE DETECTED 04/04/2014 2133   COCAINSCRNUR NONE DETECTED 04/04/2014 2133   LABBENZ NONE DETECTED 04/04/2014 2133   AMPHETMU NONE DETECTED 04/04/2014 2133   THCU NONE DETECTED 04/04/2014 2133   LABBARB NONE DETECTED 04/04/2014 2133     Recent Labs Lab 04/04/14 1602  ETH <11    Ct Angio Head W/cm &/or Wo Cm 04/05/2014    1. Evolving acute right MCA territory infarct. No intracranial hemorrhage. 2. Right M1 segment and right MCA bifurcation are patent. Moderate to severe proximal right M2 branch vessel stenosis.     Ct Head Wo  Contrast 04/04/2014    1. Dense right MCA, concerning for acute thrombus in the distal M1/proximal M2 segments. No definite acute infarct is identified by CT. 2. No acute intracranial hemorrhage.     PHYSICAL EXAM Frail elderly lady not in distress.Awake alert. Afebrile. Head is nontraumatic. Neck is supple without bruit. Hearing is normal. Cardiac exam no murmur or gallop. Lungs are clear to auscultation. Distal pulses are well felt. Neurological Exam ; awake alert oriented x2. Mild dysarthria but can be easily understood. Mild expressive language difficulties. Follows one and two-step commands. Right gaze deviation. Left gaze palsy and able to partially look to the left. Blinks to threat on the right but not on the left. Left facial weakness. Tongue deviated to the left. Dense left upper extremity weakness with 0/5 strength. Grade 2-3/5 strength in the left lower extremity. Diminished sensation on the left. Mild left hemi-neglect. Left plantar upgoing without downgoing. NIH stroke scale score 12   ASSESSMENT/PLAN [name] is a 80 y.o. female with history of atrial fibrillation on Coumadin therapy, permanent pacemaker, dementia, and hypertension presenting with right gaze preference, left-sided weakness, left facial droop, dysarthria, and was complaining of a headache. She did receive IV t-PA 04/04/2014 at 1644. Imaging confirms a R MCA infarct. Stroke work up underway.  StrokeTIA:  Dense right MCA with right MCA cortical infarct s/p IV tPA, embolic secondary to known atrial fibrillation   warfarin prior to admission, INR 1.26 on admission, now on aspirin 300 mg suppository. Change to Eliquis 2.5 mg twice daily.  CT - dense R MCA  CTA head - R MCA territory infarct, no lg vessel occlusion Carotid Doppler  The vertebral arteries appear patent with antegrade flow. - Findings consistent with 1-39 percent stenosis involving the right internal carotid artery and the left internal  carotid artery.   2D Echo  - no source of embolus  LDL 64, no statin necessary as meeting goal of LDL <100  HgbA1c 5.6 WNL  SCDs for VTE prophylaxis  Dysphagia. Failed swallow eval. ST following, will reassess.  Progress to OOB  Resultant left UE>LE hemiparesis, left sided neurologic neglect, field cut  Therapy needs:  pending   Ongoing aggressive risk factor management   Transfer to the floor   and home meds will be resumed  Disposition:  pending   Atrial Fibrillation  RVR as unable to take po meds; using IV prn meds  CHA2DS2-VASc Score = 7  (?2 oral anticoagulation recommended)   Home meds:  Coumadin, subtherapeutic at 1.26 on arrival   Hypertension   Home meds:  Verapamil, lopressor.  Not resumed in hospital BP  124-164/58-88 past 24h (04/07/2014 @ 2:42 PM)  SBP goal <180  Stable  Other Stroke Risk Factors Advanced age   Family hx stroke (father)  Other Active Problems  Hypertensive heart disease on lasix  Other Pertinent History  Baseline dementia, lives at [facility]  Hx breast cancer  Hx bladder cancer  Pacemaker 2005  Depression  Hypothyroid on synthroid  Hospital day # 3  Burnetta Sabin, MSN, RN, ANVP-BC, ANP-BC, [facility] Pager: [phone] 04/07/2014 2:42 PM   Discussed with family and answered questions.  Antony Contras, MD Medical Director [facility] Pager: [phone] 04/07/2014 2:42 PM    To contact Stroke Continuity provider, please refer to [email or web address] After hours, contact General Neurology

## 2014-04-07 NOTE — Plan of Care (Signed)
Problem: Consults Goal: Ischemic Stroke Patient Education See Patient Education Module for education specifics.  Outcome: Completed/Met Date Met:  04/07/14 Stroke Education w/ pt's daughter Chales Abrahams d/t pt's baseline dementia & memory loss  Problem: tPA Day Progression Outcomes-Only if tPA administered Goal: Post tPA neurologically at baseline or improved CRITERIA FOR NEUROLOGICALLY AT BASELINE OR IMPROVED: - NO S/S OF INC. ICP - AWAKE, ALERT, ORIENTED X3 - SPEECH CLEAR, APPROPRIATE - PERRL - EOMS, BLINK INTACT - FACE SYMMETRICAL - TONGUE/TRACH MIDLINE - GRIPS, PUSH/PULL EQUAL - NO PRONATOR DRIFT - DORSIPLANTAR FLEXION EQUAL - MOVES ALL EXTREMITIES - SENSATION INTACT - NO NUCHAL RIGIDITY OR PHOTOPHOBIA  Outcome: Not Met (add Reason) NIHSS remains 11-12

## 2014-04-07 NOTE — Progress Notes (Signed)
Speech Language Pathology Treatment: Dysphagia;Cognitive-Linquistic  Patient Details Name: Heidi Harrell MRN: [id number] DOB: 04-Feb-1919 Today's Date: 04/07/2014 Time: 4315-4008 SLP Time Calculation (min): 24 min  Assessment / Plan / Recommendation Clinical Impression  Treatment session focused on educating dtr regarding safe swallowing techniques, thickening. Pt tolerated teaspoon trials of pudding thick liquids without significantly oral residue, minimal wet vocal quality.l Though pt lethargic, she was able to follow verbal commands and respond to verbal cueing. Dtr return demonstrated feeding, suctioning dn thickening. Recommend pt continue current diet until arousal improved.    HPI     Pertinent Vitals    SLP Plan  Continue with current plan of care    Recommendations Diet recommendations: Dysphagia 1 (puree);Pudding-thick liquid Liquids provided via: Teaspoon Medication Administration: Crushed with puree Supervision: Staff to assist with self feeding;Full supervision/cueing for compensatory strategies Compensations: Slow rate;Small sips/bites;Check for pocketing;Check for anterior loss Postural Changes and/or Swallow Maneuvers: Seated upright 90 degrees              Oral Care Recommendations: Oral care before and after PO Follow up Recommendations: Skilled Nursing facility Plan: Continue with current plan of care    GO    [facility], MA CCC-SLP [phone]  Lynann Beaver 04/07/2014, 11:12 AM

## 2014-04-07 NOTE — Clinical Social Work Placement (Addendum)
Clinical Social Work Department CLINICAL SOCIAL WORK PLACEMENT NOTE 04/07/2014  Patient:  Heidi Harrell, Heidi Harrell  Account Number:  [id number] Admit date:  04/04/2014  Clinical Social Worker:  Barbette Or, LCSW  Date/time:  04/07/2014 11:00 AM  Clinical Social Work is seeking post-discharge placement for this patient at the following level of care:   [place]   (*CSW will update this form in Epic as items are completed)   04/07/2014  Patient/family provided with [place] Department of Clinical Social Work's list of facilities offering this level of care within the geographic area requested by the patient (or if unable, by the patient's family).  04/07/2014  Patient/family informed of their freedom to choose among providers that offer the needed level of care, that participate in Medicare, Medicaid or managed care program needed by the patient, have an available bed and are willing to accept the patient.  04/07/2014  Patient/family informed of [facility]' ownership interest in [facility], as well as of the fact that they are under no obligation to receive care at this facility.  PASARR submitted to EDS on 04/09/2014  PASARR number received on 04/09/2014   FL2 transmitted to all facilities in geographic area requested by pt/family on  04/07/2014 FL2 transmitted to all facilities within larger geographic area on   Patient informed that his/her managed care company has contracts with or will negotiate with  certain facilities, including the following:     Patient/family informed of bed offers received:  04/08/2014 Patient chooses bed at [facility] Physician recommends and patient chooses bed at    Patient to be transferred to  04/09/2014 on  [facility] Patient to be transferred to facility by [facility] Patient and family notified of transfer on 04/09/2014  Name of family member notified:  Ok Anis (daughters)  The following physician request were entered in  Epic:   Additional Comments: 04/07/14 - no submission for Pasarr at this time.  Awaiting to confirm that patient does not return to ALF prior to [facility] Pasarr submission

## 2014-04-08 MED ORDER — APIXABAN 5 MG PO TABS
5.0000 mg | ORAL_TABLET | Freq: Two times a day (BID) | ORAL | Status: DC
  Administered 2014-04-08 – 2014-04-09 (×2): 5 mg via ORAL
  Filled 2014-04-08 (×4): qty 1

## 2014-04-08 NOTE — Clinical Social Work Note (Signed)
Clinical Social Worker continuing to follow patient and family for support and discharge planning needs.  CSW spoke with patient daughter, Jeanne Ivan at bedside who plans to contact patient other daughter to discuss bed offers.  Patient daughter plans to provide CSW with bed choice later today.  CSW updated RN and patient daughter stating that patient will need to have restraints and sitter removed 24 hours prior to discharge.  CSW remains available for support and to facilitate patient discharge needs once medically ready.  Barbette Or, [place]

## 2014-04-08 NOTE — Progress Notes (Signed)
STROKE TEAM PROGRESS NOTE   HISTORY Heidi Harrell is a 80 y.o. female who resides in the memory care unit at the [facility] in [place] with a history of atrial fibrillation on Coumadin therapy, permanent pacemaker, dementia, and hypertension. Yesterday 04/03/2014 the patient had a TIA that consisted of a facial droop which lasted approximately 2 minutes according to the patient's daughter. When this resolved she was back to baseline. Today 04/04/2014 at approximately 1510 the patient was noted to have left-sided weakness with dysarthria and a facial droop. Emergency services was contacted and the patient was brought to the [facility] emergency department by EMS. On arrival she had a right gaze preference, left-sided weakness, left facial droop, dysarthria, and was complaining of a headache. A stat CT scan was performed which revealed a dense right MCA, concerning for acute thrombus in the distal M1/proximal M2 segments but no definite acute infarct. The patient is known to have a DO NOT RESUSCITATE order; however, the patient's daughter would like to have her treated aggressively and has agreed to [facility] therapy if the patient is an appropriate candidate. An INR is pending at the time of this dictation. Her NIH scale on admission was 13 on admission. Patient was administered TPA. She was admitted to the neuro ICU for further evaluation and treatment.   SUBJECTIVE (INTERVAL HISTORY) Neurologically stable, moving left leg off the bed, left arm still plegic.remained stable overnight. Passed swallow exam y`day. Now on Dysphagia 1 diet.  . Family is agreeable to rehabilitation in a skilled nursing facility and social work consulted. Patient not transferred out of the unit as there is no fluid bed available   OBJECTIVE Temp:  [97.1 F (36.2 C)-98.8 F (37.1 C)] 98.1 F (36.7 C) (09/10 1200) Pulse Rate:  [43-188] 84 (09/10 1212) Cardiac Rhythm:  [-] Atrial fibrillation (09/10  0900) Resp:  [16-27] 21 (09/10 1212) BP: (123-169)/(63-104) 146/97 mmHg (09/10 1212) SpO2:  [90 %-97 %] 94 % (09/10 1212)    Recent Labs Lab 04/04/14 1602 04/04/14 1608 04/04/14 1830 04/04/14 2225 04/05/14 0258  NA 139 138  --   --  139  K 2.9* 2.6* 3.4* 3.5* 3.9  CL 98 99  --   --  103  CO2 26  --   --   --  24  GLUCOSE 150* 155*  --   --  106*  BUN 22 21  --   --  16  CREATININE 1.06 1.10  --   --  0.86  CALCIUM 8.9  --   --   --  8.4    Recent Labs Lab 04/04/14 1602  AST 27  ALT 16  ALKPHOS 103  BILITOT 0.3  PROT 7.3  ALBUMIN 3.5    Recent Labs Lab 04/04/14 1602 04/04/14 1608 04/05/14 0258  WBC 6.0  --  9.9  NEUTROABS 3.6  --   --   HGB 11.2* 11.9* 10.9*  HCT 33.5* 35.0* 32.8*  MCV 101.5*  --  102.5*  PLT 314  --  301   No results found for this basename: CKTOTAL, CKMB, CKMBINDEX, TROPONINI,  in the last 168 hours No results found for this basename: LABPROT, INR,  in the last 72 hours No results found for this basename: COLORURINE, APPERANCEUR, LABSPEC, PHURINE, GLUCOSEU, HGBUR, BILIRUBINUR, KETONESUR, PROTEINUR, UROBILINOGEN, NITRITE, LEUKOCYTESUR,  in the last 72 hours     Component Value Date/Time   CHOL 134 04/05/2014 0258   TRIG 32 04/05/2014 0258  HDL 64 04/05/2014 0258   CHOLHDL 2.1 04/05/2014 0258   VLDL 6 04/05/2014 0258   LDLCALC 64 04/05/2014 0258   Lab Results  Component Value Date   HGBA1C 5.6 04/05/2014      Component Value Date/Time   LABOPIA NONE DETECTED 04/04/2014 2133   COCAINSCRNUR NONE DETECTED 04/04/2014 2133   LABBENZ NONE DETECTED 04/04/2014 2133   AMPHETMU NONE DETECTED 04/04/2014 2133   THCU NONE DETECTED 04/04/2014 2133   LABBARB NONE DETECTED 04/04/2014 2133     Recent Labs Lab 04/04/14 1602  ETH <11    Ct Angio Head W/cm &/or Wo Cm 04/05/2014    1. Evolving acute right MCA territory infarct. No intracranial hemorrhage. 2. Right M1 segment and right MCA bifurcation are patent. Moderate to severe proximal right M2 branch vessel  stenosis.     Ct Head Wo Contrast 04/04/2014    1. Dense right MCA, concerning for acute thrombus in the distal M1/proximal M2 segments. No definite acute infarct is identified by CT. 2. No acute intracranial hemorrhage.     PHYSICAL EXAM Frail elderly lady not in distress.Awake alert. Afebrile. Head is nontraumatic. Neck is supple without bruit. Hearing is normal. Cardiac exam no murmur or gallop. Lungs are clear to auscultation. Distal pulses are well felt. Neurological Exam ; awake alert oriented x2. Mild dysarthria but can be easily understood. Mild expressive language difficulties. Follows one and two-step commands. Right gaze deviation. Left gaze palsy and able to partially look to the left. Blinks to threat on the right but not on the left. Left facial weakness. Tongue deviated to the left. Dense left upper extremity weakness with 0/5 strength. Grade 2-3/5 strength in the left lower extremity. Diminished sensation on the left. Mild left hemi-neglect. Left plantar upgoing without downgoing. NIH stroke scale score 12   ASSESSMENT/PLAN [name] is a 80 y.o. female with history of atrial fibrillation on Coumadin therapy, permanent pacemaker, dementia, and hypertension presenting with right gaze preference, left-sided weakness, left facial droop, dysarthria, and was complaining of a headache. She did receive IV t-PA 04/04/2014 at 1644. Imaging confirms a R MCA infarct. Stroke work up underway.  StrokeTIA:  Dense right MCA with right MCA cortical infarct s/p IV tPA, embolic secondary to known atrial fibrillation   warfarin prior to admission, INR 1.26 on admission, now on aspirin 300 mg suppository. Change to Eliquis 2.5 mg twice daily.  CT - dense R MCA  CTA head - R MCA territory infarct, no lg vessel occlusion Carotid Doppler  The vertebral arteries appear patent with antegrade flow. - Findings consistent with 1-39 percent stenosis involving the right internal carotid artery and  the left internal carotid artery.   2D Echo  - no source of embolus  LDL 64, no statin necessary as meeting goal of LDL <100  HgbA1c 5.6 WNL  SCDs for VTE prophylaxis  Dysphagia. Failed swallow eval. ST following, will reassess.  Progress to OOB  Resultant left UE>LE hemiparesis, left sided neurologic neglect, field cut  Therapy needs:  pending   Ongoing aggressive risk factor management   Transfer to the floor   and home meds have been resumed  Disposition:  pending   Atrial Fibrillation  RVR as unable to take po meds; using IV prn meds  CHA2DS2-VASc Score = 7  (?2 oral anticoagulation recommended)   Home meds:  Coumadin, subtherapeutic at 1.26 on arrival   Hypertension   Home meds:  Verapamil, lopressor. Not resumed in hospital BP  124-164/58-88 past 24h (04/08/2014 @ 1:23 PM)  SBP goal <180  Stable  Other Stroke Risk Factors Advanced age   Family hx stroke (father)  Other Active Problems  Hypertensive heart disease on lasix  Other Pertinent History  Baseline dementia, lives at [place]  Hx breast cancer  Hx bladder cancer  Pacemaker 2005  Depression  Hypothyroid on synthroid  Hospital day # 4     Discussed with family and answered questions.  Antony Contras, MD Medical Director [facility] Pager: [phone] 04/08/2014 1:23 PM    To contact Stroke Continuity provider, please refer to [email or web address] After hours, contact General Neurology

## 2014-04-08 NOTE — Progress Notes (Signed)
UR completed.  Katelyne Galster, RN BSN [facility] CCM Trauma/Neuro ICU Case Manager [phone]  

## 2014-04-08 NOTE — Progress Notes (Signed)
ANTICOAGULATION CONSULT NOTE - Initial Consult  Pharmacy Consult:  Eliquis Indication:  Non-valvular AFib  Allergies  Allergen Reactions  . Toprol Xl [Metoprolol Tartrate] Other (See Comments)    Dizziness   . Actonel [Risedronate Sodium] Other (See Comments)    Unknown  . Codeine Nausea And Vomiting  . Fosamax [Alendronate Sodium] Other (See Comments)    Per MAR  . Sulfa Antibiotics Other (See Comments)    unknown    Patient Measurements: Height: '5\' 1"'  (154.9 cm) Weight: 154 lb 15.7 oz (70.3 kg) IBW/kg (Calculated) : 47.8  Vital Signs: Temp: 97.9 F (36.6 C) (09/10 1521) Temp src: Oral (09/10 1521) BP: 137/95 mmHg (09/10 1602) Pulse Rate: 95 (09/10 1602)  Labs: No results found for this basename: HGB, HCT, PLT, APTT, LABPROT, INR, HEPARINUNFRC, CREATININE, CKTOTAL, CKMB, TROPONINI,  in the last 72 hours  Estimated Creatinine Clearance: 36.6 ml/min (by C-G formula based on Cr of 0.86).   Medical History: Past Medical History  Diagnosis Date  . Dementia   . Hypothyroid   . GERD (gastroesophageal reflux disease)   . Atrial fibrillation   . Osteoporosis   . Depression   . Bladder cancer   . Breast cancer   . Hypertensive heart disease   . Presence of permanent cardiac pacemaker 09/20/2003    [employer] EnPulse  . LBBB (left bundle branch block)       Assessment: 89 YOF admitted 04/04/14 with code stroke and received tPA.  She was started on Eliquis on 04/07/14 at the reduced dose although she only met one criteria (age > 79, weight > 60kg, SCr < 1.5).  Pharmacy now consulted to manage Eliquis.  Baseline labs reviewed.   Goal of Therapy:  Appropriate anticoagulation    Plan:  - Change Eliquis to 37m PO BID - Monitor BMET / CBC periodically    Gianah Batt D. DMina Marble PharmD, BCPS Pager:  [phone]/04/2014, 6:11 PM

## 2014-04-08 NOTE — Progress Notes (Signed)
Speech Language Pathology Treatment: Dysphagia  Patient Details Name: Heidi Harrell MRN: [id number] DOB: [birth year] Today's Date: 04/08/2014 Time: 6803-2122 SLP Time Calculation (min): 16 min  Assessment / Plan / Recommendation Clinical Impression  SLP assisted RN and pt family in preparing and administering medications crushed in thickened liquids. Pt required moderate to max verbal and contextual cues to reduce oral holding and total assist to remove oral residuals on left. Miralax added to mixture resulting in honey thick texture, leading to increased evidence of residuals and penetration. SLP provided max cueing for throat clears and second swallows to protect airway. Max cues also provided to sustain attention to basic functional and verbal task due to ongoing lethargy. Pt is not ready for diet upgrade, continue current plan and recommendations.    HPI     Pertinent Vitals Pain Assessment: No/denies pain  SLP Plan  Continue with current plan of care    Recommendations Diet recommendations: Dysphagia 1 (puree);Pudding-thick liquid Liquids provided via: Teaspoon Medication Administration: Crushed with puree Supervision: Staff to assist with self feeding;Full supervision/cueing for compensatory strategies Compensations: Slow rate;Small sips/bites;Check for pocketing;Check for anterior loss Postural Changes and/or Swallow Maneuvers: Seated upright 90 degrees              Oral Care Recommendations: Oral care before and after PO Follow up Recommendations: Skilled Nursing facility Plan: Continue with current plan of care    GO    [facility], MA CCC-SLP [phone]  Lynann Beaver 04/08/2014, 10:10 AM

## 2014-04-09 MED ORDER — ACETAMINOPHEN 325 MG PO TABS: 650.0000 mg | ORAL_TABLET | ORAL | Status: AC | PRN

## 2014-04-09 MED ORDER — APIXABAN 5 MG PO TABS: 5.0000 mg | ORAL_TABLET | Freq: Two times a day (BID) | ORAL | Status: AC

## 2014-04-09 NOTE — Discharge Instructions (Signed)
Information on my medicine - ELIQUIS (apixaban)   Why was Eliquis prescribed for you? Eliquis was prescribed for you to reduce the risk of a blood clot forming that can cause a stroke if you have a medical condition called atrial fibrillation (a type of irregular heartbeat).  What do You need to know about Eliquis ? Take your Eliquis TWICE DAILY - one tablet in the morning and one tablet in the evening with or without food. If you have difficulty swallowing the tablet whole please discuss with your pharmacist how to take the medication safely.  Take Eliquis exactly as prescribed by your doctor and DO NOT stop taking Eliquis without talking to the doctor who prescribed the medication.  Stopping may increase your risk of developing a stroke.  Refill your prescription before you run out.  After discharge, you should have regular check-up appointments with your healthcare provider that is prescribing your Eliquis.  In the future your dose may need to be changed if your kidney function or weight changes by a significant amount or as you get older.  What do you do if you miss a dose? If you miss a dose, take it as soon as you remember on the same day and resume taking twice daily.  Do not take more than one dose of ELIQUIS at the same time to make up a missed dose.  Important Safety Information A possible side effect of Eliquis is bleeding. You should call your healthcare provider right away if you experience any of the following:   Bleeding from an injury or your nose that does not stop.   Unusual colored urine (red or dark brown) or unusual colored stools (red or black).   Unusual bruising for unknown reasons.   A serious fall or if you hit your head (even if there is no bleeding).  Some medicines may interact with Eliquis and might increase your risk of bleeding or clotting while on Eliquis. To help avoid this, consult your healthcare provider or pharmacist prior to using any new  prescription or non-prescription medications, including herbals, vitamins, non-steroidal anti-inflammatory drugs (NSAIDs) and supplements.  This website has more information on Eliquis (apixaban): http://www.eliquis.com/eliquis/homeInformation on my medicine - ELIQUIS (apixaban)  This medication education was reviewed with me or my healthcare representative as part of my discharge preparation.  The pharmacist that spoke with me during my hospital stay was:  Heidi Harrell, [facility]  Why was Eliquis prescribed for you? Eliquis was prescribed for you to reduce the risk of a blood clot forming that can cause a stroke if you have a medical condition called atrial fibrillation (a type of irregular heartbeat).  What do You need to know about Eliquis ? Take your Eliquis TWICE DAILY - one tablet in the morning and one tablet in the evening with or without food. If you have difficulty swallowing the tablet whole please discuss with your pharmacist how to take the medication safely.  Take Eliquis exactly as prescribed by your doctor and DO NOT stop taking Eliquis without talking to the doctor who prescribed the medication.  Stopping may increase your risk of developing a stroke.  Refill your prescription before you run out.  After discharge, you should have regular check-up appointments with your healthcare provider that is prescribing your Eliquis.  In the future your dose may need to be changed if your kidney function or weight changes by a significant amount or as you get older.  What do you do if you miss  a dose? If you miss a dose, take it as soon as you remember on the same day and resume taking twice daily.  Do not take more than one dose of ELIQUIS at the same time to make up a missed dose.  Important Safety Information A possible side effect of Eliquis is bleeding. You should call your healthcare provider right away if you experience any of the following:   Bleeding from an injury or your  nose that does not stop.   Unusual colored urine (red or dark brown) or unusual colored stools (red or black).   Unusual bruising for unknown reasons.   A serious fall or if you hit your head (even if there is no bleeding).  Some medicines may interact with Eliquis and might increase your risk of bleeding or clotting while on Eliquis. To help avoid this, consult your healthcare provider or pharmacist prior to using any new prescription or non-prescription medications, including herbals, vitamins, non-steroidal anti-inflammatory drugs (NSAIDs) and supplements.  This website has more information on Eliquis (apixaban): http://www.eliquis.com/eliquis/homeInformation on my medicine - ELIQUIS (apixaban)  This medication education was reviewed with me or my healthcare representative as part of my discharge preparation.  The pharmacist that spoke with me during my hospital stay was:  Heidi Harrell, [facility]  Why was Eliquis prescribed for you? Eliquis was prescribed for you to reduce the risk of a blood clot forming that can cause a stroke if you have a medical condition called atrial fibrillation (a type of irregular heartbeat).  What do You need to know about Eliquis ? Take your Eliquis TWICE DAILY - one tablet in the morning and one tablet in the evening with or without food. If you have difficulty swallowing the tablet whole please discuss with your pharmacist how to take the medication safely.  Take Eliquis exactly as prescribed by your doctor and DO NOT stop taking Eliquis without talking to the doctor who prescribed the medication.  Stopping may increase your risk of developing a stroke.  Refill your prescription before you run out.  After discharge, you should have regular check-up appointments with your healthcare provider that is prescribing your Eliquis.  In the future your dose may need to be changed if your kidney function or weight changes by a significant amount or as you get  older.  What do you do if you miss a dose? If you miss a dose, take it as soon as you remember on the same day and resume taking twice daily.  Do not take more than one dose of ELIQUIS at the same time to make up a missed dose.  Important Safety Information A possible side effect of Eliquis is bleeding. You should call your healthcare provider right away if you experience any of the following:   Bleeding from an injury or your nose that does not stop.   Unusual colored urine (red or dark brown) or unusual colored stools (red or black).   Unusual bruising for unknown reasons.   A serious fall or if you hit your head (even if there is no bleeding).  Some medicines may interact with Eliquis and might increase your risk of bleeding or clotting while on Eliquis. To help avoid this, consult your healthcare provider or pharmacist prior to using any new prescription or non-prescription medications, including herbals, vitamins, non-steroidal anti-inflammatory drugs (NSAIDs) and supplements.  This website has more information on Eliquis (apixaban): http://www.eliquis.com/eliquis/home

## 2014-04-09 NOTE — Care Management Note (Unsigned)
    Page 1 of 1   04/09/2014     4:06:00 PM CARE MANAGEMENT NOTE 04/09/2014  Patient:  Heidi Harrell, Heidi Harrell   Account Number:  [id number]  Date Initiated:  04/08/2014  Documentation initiated by:  Sandi Mariscal  Subjective/Objective Assessment:   CVA     Action/Plan:   pt planned for SNF d/c   Anticipated DC Date:  04/11/2014   Anticipated DC Plan:  SKILLED NURSING FACILITY  In-house referral  Clinical Social Worker         Choice offered to / List presented to:             Status of service:  In process, will continue to follow Medicare Important Message given?  YES (If response is "NO", the following Medicare IM given date fields will be blank) Date Medicare IM given:  04/09/2014 Medicare IM given by:  GRAVES-BIGELOW,Madelaine Whipple Date Additional Medicare IM given:   Additional Medicare IM given by:    Discharge Disposition:    Per UR Regulation:  Reviewed for med. necessity/level of care/duration of stay  If discussed at [facility] of Stay Meetings, dates discussed:    Comments:

## 2014-04-09 NOTE — Progress Notes (Signed)
CSW (Clinical Education officer, museum) prepared pt dc packet and placed with shadow chart. CSW arranged non-emergent ambulance transport for 1:30pm per facility request. Pt family, pt nurse, and facility informed. CSW signing off.  [place], [place]

## 2014-04-09 NOTE — Progress Notes (Signed)
Physical Therapy Treatment Patient Details Name: Heidi Harrell MRN: [id number] DOB: [birth year] Today's Date: 04/09/2014    History of Present Illness Heidi Harrell is a 80 y.o. female who resides in the memory care unit at the [facility].Pt with  dementia and hypertension. The patient was noted to have left-sided weakness with dysarthria and a facial droop. EmergencyOn arrival she had a right gaze preference, left-sided weakness, left facial droop, dysarthria. CT scan was performed which revealed a dense right MCA, concerning for acute thrombus in the distal M1/proximal M2 segments but no definite acute infarct.  TPA given,    PT Comments    Daughter in expressing concern about pt going to the options of SNF that are available especially since she is leaving.  Pt very engaged sitting and standing today versus in bed, where she is very difficult to awaken.  Her presentation is still with dense LUE weakness and recommend SNF for her level of ability now.  Follow Up Recommendations  SNF;Supervision for mobility/OOB     Equipment Recommendations  Other (comment) (gait belt)    Recommendations for Other Services Other (comment) (case management)     Precautions / Restrictions Precautions Precautions: ICD/Pacemaker;Fall Restrictions Weight Bearing Restrictions: No Other Position/Activity Restrictions: L UE dense weakness    Mobility  Bed Mobility Overal bed mobility: Needs Assistance Bed Mobility: Rolling;Supine to Sit Rolling: Mod assist   Supine to sit: Mod assist     General bed mobility comments: continually cued sequence, including steps to initiate with RLE and reaching across to rail, and tends to try to get back onto bed.  Seems to forget or lose focus on the task  Transfers Overall transfer level: Needs assistance Equipment used: 1 person hand held assist Transfers: Sit to/from [facility] Sit to Stand: Mod assist Stand pivot  transfers: Mod assist       General transfer comment: Stood with good effort to control with BLE's but needs continual support LUE due to weakness.  Reached back well with RUE to sit.  Ambulation/Gait                 Stairs            Wheelchair Mobility    Modified Rankin (Stroke Patients Only) Modified Rankin (Stroke Patients Only) Modified Rankin: Moderately severe disability     Balance Overall balance assessment: Needs assistance Sitting-balance support: Feet supported;Single extremity supported Sitting balance-Leahy Scale: Fair Sitting balance - Comments: Pt lethargic and not safe to leave unattended even a moment Postural control: Posterior lean Standing balance support: Bilateral upper extremity supported Standing balance-Leahy Scale: Poor Standing balance comment: Pt was more attentive once on her feet and with vc's to step could control standing better today than last note.                    Cognition Arousal/Alertness: Lethargic Behavior During Therapy: Flat affect Overall Cognitive Status: Impaired/Different from baseline Area of Impairment: Attention;Following commands;Awareness;Problem solving   Current Attention Level: Selective Memory: Decreased recall of precautions;Decreased short-term memory Following Commands: Follows one step commands inconsistently   Awareness: Emergent Problem Solving: Slow processing;Decreased initiation;Difficulty sequencing;Requires verbal cues;Requires tactile cues General Comments: Pt relatively unaware of safety sitting, needed reminders not to lean forward and has very limited awareness of LUE    Exercises General Exercises - Lower Extremity Long Arc Quad: Left;Strengthening;10 reps Heel Slides: Strengthening;Left;10 reps Hip Flexion/Marching: AROM;Left;10 reps    General Comments General comments (  skin integrity, edema, etc.): Daughter concerned about choices for SNF.  Asked her to see if another  facillity might offer a bed if she had concerns which SW/case manageement could see.      Pertinent Vitals/Pain Pain Assessment: No/denies pain BP 148/92, pulse 81 and O2 sat 94% per nsg reports    Home Living                      Prior Function            PT Goals (current goals can now be found in the care plan section) Acute Rehab PT Goals Patient Stated Goal: none stated Time For Goal Achievement: 04/20/14 Potential to Achieve Goals: Good Progress towards PT goals: Progressing toward goals    Frequency  Min 3X/week    PT Plan Current plan remains appropriate    Co-evaluation             End of Session Equipment Utilized During Treatment: Gait belt Activity Tolerance: Patient tolerated treatment well Patient left: in chair;with call bell/phone within reach;with bed alarm set;with family/visitor present     Time: 0912-0945 PT Time Calculation (min): 33 min  Charges:  $Therapeutic Exercise: 8-22 mins $Therapeutic Activity: 8-22 mins                    G Codes:      Ramond Dial 2014-05-02, 10:04 AM  Mee Hives, PT MS Acute Rehab Dept. Number: [phone]

## 2014-04-09 NOTE — Discharge Summary (Addendum)
Stroke Discharge Summary  Patient ID: Heidi Harrell   MRN: [id number]      DOB: 1918/10/11  Date of Admission: 04/04/2014 Date of Discharge: 04/09/2014  Attending Physician:  Antony Contras, MD, Stroke MD  Consulting Physician(s):   Treatment Team:  Md Stroke, MD Jim Like, DO Patient's PCP:  Tawanna Solo, MD  DISCHARGE DIAGNOSIS: R MCA infarct likely embolic secondary to A fib Active Problems:   Stroke  BMI: Body mass index is 27.45 kg/(m^2).  Past Medical History  Diagnosis Date  . Dementia   . Hypothyroid   . GERD (gastroesophageal reflux disease)   . Atrial fibrillation   . Osteoporosis   . Depression   . Bladder cancer   . Breast cancer   . Hypertensive heart disease   . Presence of permanent cardiac pacemaker 09/20/2003    Medtronic EnPulse  . LBBB (left bundle branch block)    Past Surgical History  Procedure Laterality Date  . Permanent pacemaker insertion  09/20/2003    Medtronic EnPulse  . Cardiac catheterization  09/20/2003    patent coronary arteries  . US echocardiography  01/15/2012    EF 45-50%,AL severley dilated,mod. mitral annular ca+,mild MR,mod TR,AOV mod. sclerotic  . Permanent pacemaker generator change  02/10/2013    Medtronic      Medication List    ASK your doctor about these medications       CALTRATE 600 PLUS-VIT D PO  Take 1 tablet by mouth 2 (two) times daily.     fluticasone 50 MCG/ACT nasal spray  Commonly known as:  FLONASE  Place 2 sprays into both nostrils daily.     furosemide 40 MG tablet  Commonly known as:  LASIX  Take 40 mg by mouth daily. Takes an additional tablet if weight is over 145 lbs.     ibandronate 150 MG tablet  Commonly known as:  BONIVA  Take 150 mg by mouth every 30 (thirty) days. Take in the morning with a full glass of water, on an empty stomach, and do not take anything else by mouth or lie down for the next 30 min.     levothyroxine 125 MCG tablet  Commonly known as:  SYNTHROID, LEVOTHROID   Take 125 mcg by mouth daily.     metoprolol 50 MG tablet  Commonly known as:  LOPRESSOR  Take 75 mg by mouth 2 (two) times daily.     multivitamins ther. w/minerals Tabs tablet  Take 1 tablet by mouth daily.     pantoprazole 40 MG tablet  Commonly known as:  PROTONIX  Take 40 mg by mouth daily.     polyethylene glycol packet  Commonly known as:  MIRALAX / GLYCOLAX  Take 4.25 g by mouth every other day.     potassium chloride SA 20 MEQ tablet  Commonly known as:  K-DUR,KLOR-CON  Take 20 mEq by mouth daily.     sertraline 50 MG tablet  Commonly known as:  ZOLOFT  Take 50 mg by mouth daily.     VERELAN PM 300 MG Cp24  Generic drug:  Verapamil HCl CR  Take 300 mg by mouth daily.     warfarin 2.5 MG tablet  Commonly known as:  COUMADIN  Take 2.5-3.75 mg by mouth daily. Take 2.5 mg by mouth on Tuesday, Thursday, Saturday and Sunday. Take 3.75 mg by mouth on Monday, Wednesday and Friday.        LABORATORY STUDIES CBC    Component  Value Date/Time   WBC 9.9 04/05/2014 0258   RBC 3.20* 04/05/2014 0258   HGB 10.9* 04/05/2014 0258   HCT 32.8* 04/05/2014 0258   PLT 301 04/05/2014 0258   MCV 102.5* 04/05/2014 0258   MCH 34.1* 04/05/2014 0258   MCHC 33.2 04/05/2014 0258   RDW 14.1 04/05/2014 0258   LYMPHSABS 1.6 04/04/2014 1602   MONOABS 0.7 04/04/2014 1602   EOSABS 0.2 04/04/2014 1602   BASOSABS 0.0 04/04/2014 1602   CMP    Component Value Date/Time   NA 139 04/05/2014 0258   K 3.9 04/05/2014 0258   CL 103 04/05/2014 0258   CO2 24 04/05/2014 0258   GLUCOSE 106* 04/05/2014 0258   BUN 16 04/05/2014 0258   CREATININE 0.86 04/05/2014 0258   CREATININE 0.86 02/04/2013 1339   CALCIUM 8.4 04/05/2014 0258   PROT 7.3 04/04/2014 1602   ALBUMIN 3.5 04/04/2014 1602   AST 27 04/04/2014 1602   ALT 16 04/04/2014 1602   ALKPHOS 103 04/04/2014 1602   BILITOT 0.3 04/04/2014 1602   GFRNONAA 56* 04/05/2014 0258   GFRAA 65* 04/05/2014 0258   COAGS Lab Results  Component Value Date   INR 1.26 04/04/2014   INR 2.3* 02/26/2014   INR  1.9* 02/05/2014   INR 1.9* 02/05/2014   Lipid Panel    Component Value Date/Time   CHOL 134 04/05/2014 0258   TRIG 32 04/05/2014 0258   HDL 64 04/05/2014 0258   CHOLHDL 2.1 04/05/2014 0258   VLDL 6 04/05/2014 0258   LDLCALC 64 04/05/2014 0258   HgbA1C  Lab Results  Component Value Date   HGBA1C 5.6 04/05/2014   Cardiac Panel (last 3 results) No results found for this basename: CKTOTAL, CKMB, TROPONINI, RELINDX,  in the last 72 hours Urinalysis    Component Value Date/Time   COLORURINE YELLOW 04/04/2014 2133   APPEARANCEUR CLEAR 04/04/2014 2133   LABSPEC 1.011 04/04/2014 2133   PHURINE 7.0 04/04/2014 2133   [place] 04/04/2014 2133   HGBUR MODERATE* 04/04/2014 2133   [place] 04/04/2014 2133   [place] 04/04/2014 2133   PROTEINUR NEGATIVE 04/04/2014 2133   UROBILINOGEN 0.2 04/04/2014 2133   NITRITE NEGATIVE 04/04/2014 2133   LEUKOCYTESUR NEGATIVE 04/04/2014 2133   Urine Drug Screen     Component Value Date/Time   LABOPIA NONE DETECTED 04/04/2014 2133   COCAINSCRNUR NONE DETECTED 04/04/2014 2133   LABBENZ NONE DETECTED 04/04/2014 2133   AMPHETMU NONE DETECTED 04/04/2014 2133   THCU NONE DETECTED 04/04/2014 2133   LABBARB NONE DETECTED 04/04/2014 2133    Alcohol Level    Component Value Date/Time   ETH <11 04/04/2014 1602     SIGNIFICANT DIAGNOSTIC STUDIES Ct Angio Head W/cm &/or Wo Cm  04/05/2014 1. Evolving acute right MCA territory infarct. No intracranial hemorrhage. 2. Right M1 segment and right MCA bifurcation are patent. Moderate to severe proximal right M2 branch vessel stenosis.  Ct Head Wo Contrast  04/04/2014 1. Dense right MCA, concerning for acute thrombus in the distal M1/proximal M2 segments. No definite acute infarct is identified by CT. 2. No acute intracranial hemorrhage.       HISTORY OF PRESENT ILLNESS Heidi Harrell is a 80 y.o. female who resides in the memory care unit at the [facility] in [place] with a history of atrial fibrillation on  Coumadin therapy, permanent pacemaker, dementia, and hypertension. Yesterday 04/03/2014 the patient had a TIA that consisted of a facial droop which lasted approximately 2 minutes  according to the patient's daughter. When this resolved she was back to baseline. Today 04/04/2014 at approximately 1510 the patient was noted to have left-sided weakness with dysarthria and a facial droop. Emergency services was contacted and the patient was brought to the [facility] emergency department by EMS. On arrival she had a right gaze preference, left-sided weakness, left facial droop, dysarthria, and was complaining of a headache. A stat CT scan was performed which revealed a dense right MCA, concerning for acute thrombus in the distal M1/proximal M2 segments but no definite acute infarct. The patient is known to have a DO NOT RESUSCITATE order; however, the patient's daughter would like to have her treated aggressively and has agreed to [facility] therapy if the patient is an appropriate candidate. An INR is pending at the time of this dictation. Her NIH scale on admission was 13 on admission. Patient was administered TPA. She was admitted to the neuro ICU for further evaluation and treatment.      HOSPITAL COURSE Heidi Harrell is a 80 y.o. female with history of atrial fibrillation on Coumadin therapy, permanent pacemaker, dementia, and hypertension presenting with right gaze preference, left-sided weakness, left facial droop, dysarthria, and was complaining of a headache. She did receive IV t-PA 04/04/2014 at 1644. Imaging confirms a R MCA infarct. Stroke work up underway.  StrokeTIA: Dense right MCA with right MCA cortical infarct s/p IV tPA, embolic secondary to known atrial fibrillation  warfarin prior to admission, INR 1.26 on admission, now on aspirin 300 mg suppository. Change to Eliquis 2.5 mg twice daily.  CT - dense R MCA  CTA head - R MCA territory infarct, no lg vessel occlusion Carotid Doppler The  vertebral arteries appear patent with antegrade flow. - Findings consistent with 1-39 percent stenosis involving the right internal carotid artery and the left internal carotid artery. 2D Echo - no source of embolus  LDL 64, no statin necessary as meeting goal of LDL <100  HgbA1c 5.6 WNL  SCDs for VTE prophylaxis  Dysphagia. Failed swallow eval. ST following, will reassess.  Progress to OOB  Resultant left UE>LE hemiparesis, left sided neurologic neglect, field cut  Ongoing aggressive risk factor management  Transfer to the floor and home meds have been resumed Atrial Fibrillation  RVR as unable to take po meds; using IV prn meds  CHA2DS2-VASc Score = 7 (?2 oral anticoagulation recommended)  Home meds: Coumadin, subtherapeutic at 1.26 on arrival. D/C on Eliquis Hypertension  Home meds: Verapamil, lopressor. Not resumed in hospital BP 124-164/58-88 past 24h (04/08/2014 @ 1:23 PM) SBP goal <180  Stable Other Stroke Risk Factors  Advanced age Family hx stroke (father) Other Active Problems  Hypertensive heart disease on lasix Other Pertinent History  Baseline dementia, lives at [place]  Hx breast cancer  Hx bladder cancer  Pacemaker 2005  Depression  Hypothyroid on synthroid    DISCHARGE EXAM Blood pressure 119/88, pulse 117, temperature 97.7 F (36.5 C), temperature source Oral, resp. rate 16, height 5\' 1"  (1.549 m), weight 145 lb 3.2 oz (65.862 kg), SpO2 94.00%.  awake alert oriented x2. Mild dysarthria but can be easily understood. Mild expressive language difficulties. Follows one and two-step commands. Right gaze deviation. Left gaze palsy and able to partially look to the left. Blinks to threat on the right but not on the left. Left facial weakness. Tongue deviated to the left. Dense left upper extremity weakness with 0/5 strength. Grade 2-3/5 strength in the left lower extremity. Diminished  sensation on the left. Mild left hemi-neglect. Left plantar upgoing without  downgoing.  NIH stroke scale score 12  Discharge Diet   Dysphagia   DISCHARGE PLAN  Disposition: SNF   Eliquis for secondary stroke prevention.  Ongoing risk factor control by Primary Care Physician.  Follow-up Tawanna Solo, MD in 2 weeks.  Follow-up with Dr. [facility] in 2 months.  Statin held for LDL of [address]  Jim Like, DO [facility] [phone]  A total of 23minutes was taken to complete this discharge summary.

## 2014-04-09 NOTE — Progress Notes (Signed)
CSW Armed forces technical officer) spoke with pt daughters Webb Silvers[place] and [place] and they have chosen for pt to [facility] to [facility]. CSW spoke with facility and notified. They are requesting pt daughter be at facility to complete paperwork at 11:30 and pt to go to facility at 1:30pm. CSW notified family and pt nurse. Pt nurse to notify MD of need for dc summary. FL2 on chart and needs MD signature.  [place], [place]

## 2014-04-12 ENCOUNTER — Telehealth: Payer: Self-pay

## 2014-04-12 NOTE — Telephone Encounter (Signed)
[employer] notified us they have approved coverage on Eliquis effective until 04/10/2015 Ref # [id number]

## 2014-04-14 ENCOUNTER — Telehealth: Payer: Self-pay | Admitting: Cardiovascular Disease

## 2014-04-14 NOTE — Telephone Encounter (Signed)
She wanted you to know the pt,Heidi Harrell have had a stroke. She is  a patient at [facility].m

## 2014-04-15 NOTE — Telephone Encounter (Signed)
Just FYI. Looks like INR was 1.9.Marland KitchenMarland Kitchen

## 2014-04-16 ENCOUNTER — Encounter: Payer: Self-pay | Admitting: Cardiovascular Disease

## 2014-05-12 ENCOUNTER — Telehealth: Payer: Self-pay | Admitting: *Deleted

## 2014-05-12 NOTE — Telephone Encounter (Signed)
Signed face to face encounter faxed to [facility] to cover services [phone].

## 2014-05-14 ENCOUNTER — Other Ambulatory Visit: Payer: Self-pay

## 2014-05-24 ENCOUNTER — Telehealth: Payer: Self-pay | Admitting: Cardiovascular Disease

## 2014-05-24 NOTE — Telephone Encounter (Signed)
Pt  is in Rehab now,wants to know what to do about her remote pacemaker check.

## 2014-05-24 NOTE — Telephone Encounter (Signed)
SPOKE TO DAUGHTER SHE STATES PATENT IS IN [facility]   SHE HAS BEEN THERE FOR @1  1/2 MONTHS.  DAUGHTER IS AWARE WILL DEFER TO  DEVICE POOL TO SEE WHAT ARE THE PATIENT'S OPTIONS SHE VERBALIZED UNDERSTANDING.

## 2014-05-24 NOTE — Telephone Encounter (Signed)
Informed Ms.Eulas Post that pt may send the remote transmissions from [employer]. I told her that she might have a better chance of it working by going through the fax line. Ms.Carter voiced understanding.

## 2014-05-27 ENCOUNTER — Telehealth: Payer: Self-pay | Admitting: Cardiology

## 2014-05-27 ENCOUNTER — Encounter: Payer: Medicare Other | Admitting: *Deleted

## 2014-05-27 NOTE — Telephone Encounter (Signed)
Confirmed remote transmission with pt daughter.  

## 2014-06-18 ENCOUNTER — Ambulatory Visit: Payer: Self-pay | Admitting: Pharmacist Clinician (PhC)/ Clinical Pharmacy Specialist

## 2014-06-29 DEATH — deceased

## 2014-07-08 ENCOUNTER — Telehealth: Payer: Self-pay | Admitting: Cardiovascular Disease

## 2014-07-08 ENCOUNTER — Encounter (HOSPITAL_COMMUNITY): Payer: Self-pay | Admitting: Cardiovascular Disease

## 2014-07-08 NOTE — Telephone Encounter (Signed)
Daughter Chales Abrahams calling to advise of mother's death on 23-Jul-2014.

## 2014-08-11 NOTE — Telephone Encounter (Signed)
08/11/2014 I documented in our old systems that patient is deceased, and pulled paper chart to go to [place].
